# Patient Record
Sex: Male | Born: 1960 | Race: White | Hispanic: No | Marital: Married | Smoking: Former smoker
Health system: Southern US, Community
[De-identification: ages and names within clinical notes are randomized; demographics above are authoritative.]

## PROBLEM LIST (undated history)

## (undated) DIAGNOSIS — I429 Cardiomyopathy, unspecified: Secondary | ICD-10-CM

## (undated) DIAGNOSIS — I2510 Atherosclerotic heart disease of native coronary artery without angina pectoris: Secondary | ICD-10-CM

## (undated) DIAGNOSIS — I5042 Chronic combined systolic (congestive) and diastolic (congestive) heart failure: Secondary | ICD-10-CM

## (undated) DIAGNOSIS — E669 Obesity, unspecified: Secondary | ICD-10-CM

## (undated) DIAGNOSIS — I255 Ischemic cardiomyopathy: Secondary | ICD-10-CM

## (undated) DIAGNOSIS — I219 Acute myocardial infarction, unspecified: Secondary | ICD-10-CM

## (undated) DIAGNOSIS — E785 Hyperlipidemia, unspecified: Secondary | ICD-10-CM

## (undated) DIAGNOSIS — I10 Essential (primary) hypertension: Secondary | ICD-10-CM

## (undated) DIAGNOSIS — R918 Other nonspecific abnormal finding of lung field: Secondary | ICD-10-CM

## (undated) DIAGNOSIS — I77810 Thoracic aortic ectasia: Secondary | ICD-10-CM

## (undated) HISTORY — PX: CORONARY STENT PLACEMENT: SHX1402

## (undated) HISTORY — DX: Acute myocardial infarction, unspecified: I21.9

## (undated) HISTORY — DX: Ischemic cardiomyopathy: I25.5

## (undated) HISTORY — DX: Atherosclerotic heart disease of native coronary artery without angina pectoris: I25.10

## (undated) HISTORY — DX: Essential (primary) hypertension: I10

## (undated) HISTORY — PX: OTHER SURGICAL HISTORY: SHX169

## (undated) HISTORY — DX: Other nonspecific abnormal finding of lung field: R91.8

## (undated) HISTORY — DX: Chronic combined systolic (congestive) and diastolic (congestive) heart failure: I50.42

## (undated) HISTORY — DX: Thoracic aortic ectasia: I77.810

## (undated) HISTORY — PX: ESOPHAGOGASTRODUODENOSCOPY: SHX1529

## (undated) HISTORY — PX: HERNIA REPAIR: SHX51

## (undated) HISTORY — DX: Obesity, unspecified: E66.9

## (undated) HISTORY — DX: Cardiomyopathy, unspecified: I42.9

## (undated) HISTORY — DX: Hyperlipidemia, unspecified: E78.5

---

## 2001-07-30 ENCOUNTER — Encounter: Payer: Self-pay | Admitting: Family Medicine

## 2001-07-30 ENCOUNTER — Encounter: Admission: RE | Admit: 2001-07-30 | Discharge: 2001-07-30 | Payer: Self-pay | Admitting: Family Medicine

## 2001-08-09 ENCOUNTER — Encounter: Payer: Self-pay | Admitting: Family Medicine

## 2001-08-09 ENCOUNTER — Encounter: Admission: RE | Admit: 2001-08-09 | Discharge: 2001-08-09 | Payer: Self-pay | Admitting: Family Medicine

## 2002-01-16 ENCOUNTER — Emergency Department (HOSPITAL_COMMUNITY): Admission: EM | Admit: 2002-01-16 | Discharge: 2002-01-16 | Payer: Self-pay | Admitting: Emergency Medicine

## 2006-06-20 DIAGNOSIS — I219 Acute myocardial infarction, unspecified: Secondary | ICD-10-CM

## 2006-06-20 HISTORY — DX: Acute myocardial infarction, unspecified: I21.9

## 2006-06-21 ENCOUNTER — Ambulatory Visit: Payer: Self-pay | Admitting: Cardiology

## 2006-06-21 ENCOUNTER — Inpatient Hospital Stay (HOSPITAL_COMMUNITY): Admission: EM | Admit: 2006-06-21 | Discharge: 2006-06-26 | Payer: Self-pay | Admitting: Emergency Medicine

## 2006-06-24 ENCOUNTER — Encounter: Payer: Self-pay | Admitting: Cardiology

## 2006-06-27 ENCOUNTER — Observation Stay (HOSPITAL_COMMUNITY): Admission: EM | Admit: 2006-06-27 | Discharge: 2006-06-28 | Payer: Self-pay | Admitting: Emergency Medicine

## 2006-07-23 ENCOUNTER — Ambulatory Visit: Payer: Self-pay | Admitting: Cardiology

## 2006-09-17 ENCOUNTER — Ambulatory Visit: Payer: Self-pay | Admitting: Cardiology

## 2006-09-17 ENCOUNTER — Ambulatory Visit: Payer: Self-pay

## 2006-09-17 LAB — CONVERTED CEMR LAB
ALT: 28 units/L (ref 0–40)
AST: 22 units/L (ref 0–37)
Albumin: 4.2 g/dL (ref 3.5–5.2)
Alkaline Phosphatase: 59 units/L (ref 39–117)
Bilirubin, Direct: 0.1 mg/dL (ref 0.0–0.3)
Cholesterol: 188 mg/dL (ref 0–200)
HDL: 41.8 mg/dL (ref >39.0)
LDL Cholesterol: 126 mg/dL — ABNORMAL HIGH (ref 0–99)
Total Bilirubin: 0.9 mg/dL (ref 0.3–1.2)
Total CHOL/HDL Ratio: 4.5
Total Protein: 6.8 g/dL (ref 6.0–8.3)
Triglycerides: 99 mg/dL (ref 0–149)
VLDL: 20 mg/dL (ref 0–40)

## 2006-12-08 ENCOUNTER — Ambulatory Visit: Payer: Self-pay | Admitting: Cardiology

## 2006-12-22 ENCOUNTER — Ambulatory Visit: Payer: Self-pay | Admitting: Cardiology

## 2006-12-22 LAB — CONVERTED CEMR LAB
ALT: 42 units/L — ABNORMAL HIGH (ref 0–40)
AST: 32 units/L (ref 0–37)
Albumin: 3.8 g/dL (ref 3.5–5.2)
Alkaline Phosphatase: 53 units/L (ref 39–117)
Bilirubin, Direct: 0.1 mg/dL (ref 0.0–0.3)
Cholesterol: 186 mg/dL (ref 0–200)
Glucose, Bld: 107 mg/dL — ABNORMAL HIGH (ref 70–99)
HDL: 40.9 mg/dL (ref >39.0)
LDL Cholesterol: 127 mg/dL — ABNORMAL HIGH (ref 0–99)
Total Bilirubin: 0.8 mg/dL (ref 0.3–1.2)
Total CHOL/HDL Ratio: 4.5
Total Protein: 6.5 g/dL (ref 6.0–8.3)
Triglycerides: 89 mg/dL (ref 0–149)
VLDL: 18 mg/dL (ref 0–40)

## 2007-01-21 ENCOUNTER — Ambulatory Visit: Payer: Self-pay | Admitting: Cardiology

## 2009-06-12 ENCOUNTER — Encounter (INDEPENDENT_AMBULATORY_CARE_PROVIDER_SITE_OTHER): Payer: Self-pay | Admitting: *Deleted

## 2009-07-07 ENCOUNTER — Emergency Department (HOSPITAL_COMMUNITY): Admission: EM | Admit: 2009-07-07 | Discharge: 2009-07-07 | Payer: Self-pay | Admitting: Emergency Medicine

## 2009-10-23 DIAGNOSIS — I252 Old myocardial infarction: Secondary | ICD-10-CM

## 2009-10-23 DIAGNOSIS — I10 Essential (primary) hypertension: Secondary | ICD-10-CM

## 2009-10-23 DIAGNOSIS — I2510 Atherosclerotic heart disease of native coronary artery without angina pectoris: Secondary | ICD-10-CM | POA: Insufficient documentation

## 2009-10-23 DIAGNOSIS — E669 Obesity, unspecified: Secondary | ICD-10-CM

## 2009-10-23 DIAGNOSIS — E7800 Pure hypercholesterolemia, unspecified: Secondary | ICD-10-CM | POA: Insufficient documentation

## 2009-10-24 ENCOUNTER — Ambulatory Visit: Payer: Self-pay | Admitting: Cardiology

## 2009-12-26 ENCOUNTER — Ambulatory Visit: Payer: Self-pay | Admitting: Cardiology

## 2010-01-29 ENCOUNTER — Ambulatory Visit: Payer: Self-pay | Admitting: Cardiology

## 2010-02-04 LAB — CONVERTED CEMR LAB
ALT: 30 units/L (ref 0–53)
AST: 25 units/L (ref 0–37)
Albumin: 4.3 g/dL (ref 3.5–5.2)
Alkaline Phosphatase: 64 units/L (ref 39–117)
Bilirubin, Direct: 0.1 mg/dL (ref 0.0–0.3)
Cholesterol: 197 mg/dL (ref 0–200)
HDL: 41.9 mg/dL (ref >39.00)
LDL Cholesterol: 134 mg/dL — ABNORMAL HIGH (ref 0–99)
Total Bilirubin: 0.7 mg/dL (ref 0.3–1.2)
Total CHOL/HDL Ratio: 5
Total Protein: 6.9 g/dL (ref 6.0–8.3)
Triglycerides: 108 mg/dL (ref 0.0–149.0)
VLDL: 21.6 mg/dL (ref 0.0–40.0)

## 2010-02-15 ENCOUNTER — Telehealth: Payer: Self-pay | Admitting: Cardiology

## 2010-07-28 LAB — CONVERTED CEMR LAB
ALT: 28 units/L (ref 0–53)
AST: 27 units/L (ref 0–37)
Albumin: 4.5 g/dL (ref 3.5–5.2)
Alkaline Phosphatase: 55 units/L (ref 39–117)
BUN: 16 mg/dL (ref 6–23)
Bilirubin, Direct: 0 mg/dL (ref 0.0–0.3)
CO2: 27 meq/L (ref 19–32)
Calcium: 9.6 mg/dL (ref 8.4–10.5)
Chloride: 107 meq/L (ref 96–112)
Cholesterol: 213 mg/dL — ABNORMAL HIGH (ref 0–200)
Creatinine, Ser: 1 mg/dL (ref 0.4–1.5)
Direct LDL: 152.7 mg/dL
GFR calc non Af Amer: 84.29 mL/min (ref >60)
Glucose, Bld: 93 mg/dL (ref 70–99)
HDL: 49.6 mg/dL (ref >39.00)
Potassium: 4.3 meq/L (ref 3.5–5.1)
Sodium: 141 meq/L (ref 135–145)
Total Bilirubin: 0.4 mg/dL (ref 0.3–1.2)
Total CHOL/HDL Ratio: 4
Total Protein: 7.1 g/dL (ref 6.0–8.3)
Triglycerides: 123 mg/dL (ref 0.0–149.0)
VLDL: 24.6 mg/dL (ref 0.0–40.0)

## 2010-07-30 NOTE — Assessment & Plan Note (Signed)
Summary: Luis Mitchell  kMedications Added CLARITIN 10 MG TABS (LORATADINE) take one tablets AMLODIPINE BESYLATE 5 MG TABS (AMLODIPINE BESYLATE) Take one tablet by mouth daily      Allergies Added: ! [facility]  Primary Provider:  MItchell  CC:  Luis Mitchell/per pt spouse.  Marland Kitchen  History of Present Illness: Overall patient has been doing well.  Has not seen a doctor in quite some time. His B  Current Medications (verified): 1)  Toprol Xl 100 Mg Xr24h-Tab (Metoprolol Succinate) .Marland Kitchen.. 1 Tab Once Daily 2)  Plavix 75 Mg Tabs (Clopidogrel Bisulfate) .... Take One Tablet By Mouth Daily 3)  Crestor 10 Mg Tabs (Rosuvastatin Calcium) .... Take One Tablet By Mouth Daily. 4)  Claritin 10 Mg Tabs (Loratadine) .... Take One Tablets  Allergies (verified): 1)  ! Nsaids 2)  ! Asa 3)  ! * Bananas and Kwii 4)  ! * Zpack   Vital Signs:  Patient profile:   50 year old male Height:      67 inches Weight:      259 pounds BMI:     40.71 Pulse rate:   88 / minute Pulse rhythm:   regular BP sitting:   142 / 100  (left arm) Cuff size:   large  Vitals Entered By: Burnett Kanaris, CNA (October 24, 2009 4:30 PM)  Physical Exam  General:  Overweight gentleman in no distress.   Head:  normocephalic and atraumatic Eyes:  PERRLA/EOM intact; conjunctiva and lids normal. Lungs:  Clear bilaterally to auscultation and percussion. Heart:  PMI non displaced.  Normal S1 and S2.  No murmur Abdomen:  Bowel sounds positive; abdomen soft and non-tender without masses, organomegaly, or hernias noted. No hepatosplenomegaly. Extremities:  No clubbing or cyanosis. Neurologic:  Alert and oriented x 3.   Cardiac Cath  Procedure date:  06/21/2006  Findings:       CONCLUSIONS:  1. Acute inferolateral wall myocardial infarction treated with primary      percutaneous coronary intervention with a non drug-eluting      platform.  2. A small area of distal embolization.  3. High-grade stenosis of the large diagonal branch involving also  a      bifurcation of the diagonal itself.   DISPOSITION:  I plan to review the films with my colleagues to make a  final decision regarding the diagonal.  It is clearly very unfavorable  for percutaneous intervention.  We may need to treat him medically and  since the LAD is not involved specifically, we would be reluctant to  proceed with surgery.  Cardiac Cath  Procedure date:  06/21/2006  Findings:      ANGIOGRAPHIC DATA.:  1. Ventriculography in the RAO projection revealed inferobasal      hypokinesis.  There did not appear to be significant mitral      regurgitation, the estimate of the ejection fraction being in the      range of 50%.  2. The left main is free of critical disease.  3. The LAD courses to the apex.  There is a fair amount of luminal      irregularity but no areas of high-grade focal stenosis throughout      the main body and mid and distal portions of the LAD.  Importantly,      there is a very large diagonal branch that has a 90% stenosis at      its takeoff.  Just distal to its takeoff is the second sub branch,  and this sub branch also has 70-80% narrowing at its ostium.  The      distal distribution of the diagonal is relatively large.  4. The circumflex artery demonstrates segmental plaquing of about 50%      to perhaps slightly less in the midportion of the vessel before its      bifurcation into two marginal branches.  5. The right coronary artery is a large-caliber vessel.  There some      luminal irregularity proximally and the vessel is totally occluded      in the midportion.  Following opening, the stenosis is fairly short      and discrete and just above the takeoff of a moderate-sized right      ventricular branch.  Just after this there is about 30% narrowing.      There is fairly marked tortuosity in the distal vessel prior to the     PDA with about 30% narrowing as well.  There are at least four more      side branches and then the  vessel and is with a slight area of      contrast hang-up distally.  The distal distribution beyond this is      unclear.  6. Importantly, the circumflex is at least moderate in size and likely      covers most of the lateral wall.  EKG  Procedure date:  10/24/2009  Findings:      NSR.  Inferior MI, old  Impression & Recommendations:  Problem # 1:  CORONARY ARTERY DISEASE (ICD-414.00) See cath report.  Had non DES to RCA for AMI, with some residual disease.  Has not been seen in about three years or so.  Has been stable, without chest pain.  Overall, doing well at present without ischemic symptoms.   His updated medication list for this problem includes:    Toprol Xl 100 Mg Xr24h-tab (Metoprolol succinate) .Marland Kitchen... 1 tab once daily    Plavix 75 Mg Tabs (Clopidogrel bisulfate) .Marland Kitchen... Take one tablet by mouth daily    Amlodipine Besylate 5 Mg Tabs (Amlodipine besylate) .Marland Kitchen... Take one tablet by mouth daily  Orders: EKG w/ Interpretation (93000) TLB-BMP (Basic Metabolic Panel-BMET) (80048-METABOL) TLB-Lipid Panel (80061-LIPID) TLB-Hepatic/Liver Function Pnl (80076-HEPATIC)  Problem # 2:  HYPERTENSION, UNSPECIFIED (ICD-401.9) Very poor control.  Consistent diastolics in 100 range.  Options and treatment reviewed.  Needs weight loss, and also needs additional medication.  He understands about his allergic history, and is willing to try something, and will call us if we need to change medications. Asked him to keep a log of his BP, and let us know how things look from that standpoint.  His updated medication list for this problem includes:    Toprol Xl 100 Mg Xr24h-tab (Metoprolol succinate) .Marland Kitchen... 1 tab once daily    Amlodipine Besylate 5 Mg Tabs (Amlodipine besylate) .Marland Kitchen... Take one tablet by mouth daily  Orders: EKG w/ Interpretation (93000) TLB-BMP (Basic Metabolic Panel-BMET) (80048-METABOL) TLB-Lipid Panel (80061-LIPID) TLB-Hepatic/Liver Function Pnl (80076-HEPATIC)  Problem # 3:   HYPERCHOLESTEROLEMIA (ICD-272.0) Has not had any tests in more than three years.  Will recheck lab values at this time.   His updated medication list for this problem includes:    Crestor 10 Mg Tabs (Rosuvastatin calcium) .Marland Kitchen... Take one tablet by mouth daily.  Orders: EKG w/ Interpretation (93000) TLB-BMP (Basic Metabolic Panel-BMET) (80048-METABOL) TLB-Lipid Panel (80061-LIPID) TLB-Hepatic/Liver Function Pnl (80076-HEPATIC)  Patient Instructions: 1)  Your physician recommends that you  have a lipid and liver profile, and a BMP  today.  2)  Your physician recommends that you schedule a follow-up appointment in: 3 MONTHS 3)  Your physician has recommended you make the following change in your medication: START Amlodipine 5mg  once a day Prescriptions: AMLODIPINE BESYLATE 5 MG TABS (AMLODIPINE BESYLATE) Take one tablet by mouth daily  #30 x 4   Entered by:   [name], RN, BSN   Authorized by:   [name], MD, [facility]   Signed by:   [name], RN, BSN on 10/24/2009   Method used:   Electronically to        [facility] [address] (retail)       [phone]       [place], [place]  [address]       Ph: [phone]       Fax: [phone]   RxID:   [id number]

## 2010-07-30 NOTE — Assessment & Plan Note (Signed)
Summary: 2 month rov/sl  Medications Added ZYRTEC ALLERGY 10 MG TABS (CETIRIZINE HCL) 1 tab by mouth once daily        Primary Provider:  MItchell  CC:  check up.  History of Present Illness: Just started the amlodipine a couple of weeks ago.  Last time he checked BP was 129/89.  Most of diastolics have continued to be high.  No chest pain at present.  Denies ongoing symptoms.   Just started Crestor last few days.  Not taking BP med regularly.   Current Medications (verified): 1)  Toprol Xl 100 Mg Xr24h-Tab (Metoprolol Succinate) .Marland Kitchen.. 1 Tab Once Daily 2)  Plavix 75 Mg Tabs (Clopidogrel Bisulfate) .... Take One Tablet By Mouth Daily 3)  Crestor 20 Mg Tabs (Rosuvastatin Calcium) .... Take One Tablet By Mouth Daily. 4)  Zyrtec Allergy 10 Mg Tabs (Cetirizine Hcl) .Marland Kitchen.. 1 Tab By Mouth Once Daily 5)  Amlodipine Besylate 5 Mg Tabs (Amlodipine Besylate) .... Take One Tablet By Mouth Daily  Allergies: 1)  ! Nsaids 2)  ! Asa 3)  ! * Bananas and Kwii 4)  ! * Zpack  Past History:  Past Medical History: Last updated: 10/23/2009 Current Problems:  CORONARY ARTERY DISEASE (ICD-414.00)- post percutaneous stentin  emergently with a non-drug eluting platform. MYOCARDIAL INFARCTION, HX OF (ICD-412)- anterior inferior HYPERTENSION, UNSPECIFIED (ICD-401.9) HYPERCHOLESTEROLEMIA (ICD-272.0) OBESITY, MODERATE (ICD-278.00)  Past Surgical History: Last updated: 10/23/2009  percutaneous stenting   emergently with a non-drug eluting platform.  Vital Signs:  Patient profile:   50 year old male Height:      67 inches Weight:      259 pounds BMI:     40.71 Pulse rate:   93 / minute Resp:     12 per minute BP sitting:   158 / 103  (right arm)  Vitals Entered By: Kem Parkinson (December 26, 2009 3:33 PM)  Physical Exam  General:  Well developed, well nourished, in no acute distress. Head:  normocephalic and atraumatic Eyes:  PERRLA/EOM intact; conjunctiva and lids normal. Lungs:  Clear  bilaterally to auscultation and percussion. Heart:  PMI non displaced.  Normal S1 and S2.  No murmur.  Abdomen:  Bowel sounds positive; abdomen soft and non-tender without masses, organomegaly, or hernias noted. No hepatosplenomegaly. Extremities:  No clubbing or cyanosis. Neurologic:  Alert and oriented x 3.   Impression & Recommendations:  Problem # 1:  CORONARY ARTERY DISEASE (ICD-414.00) No recurrent symptoms His updated medication list for this problem includes:    Toprol Xl 100 Mg Xr24h-tab (Metoprolol succinate) .Marland Kitchen... 1 tab once daily    Plavix 75 Mg Tabs (Clopidogrel bisulfate) .Marland Kitchen... Take one tablet by mouth daily    Amlodipine Besylate 5 Mg Tabs (Amlodipine besylate) .Marland Kitchen... Take one tablet by mouth daily  His updated medication list for this problem includes:    Toprol Xl 100 Mg Xr24h-tab (Metoprolol succinate) .Marland Kitchen... 1 tab once daily    Plavix 75 Mg Tabs (Clopidogrel bisulfate) .Marland Kitchen... Take one tablet by mouth daily    Amlodipine Besylate 5 Mg Tabs (Amlodipine besylate) .Marland Kitchen... Take one tablet by mouth daily  Problem # 2:  HYPERCHOLESTEROLEMIA (ICD-272.0) Only taking for past four days.  Will recheck after a few more days.  His updated medication list for this problem includes:    Crestor 20 Mg Tabs (Rosuvastatin calcium) .Marland Kitchen... Take one tablet by mouth daily.  Problem # 3:  HYPERTENSION, UNSPECIFIED (ICD-401.9) NOt well controlled.   His updated medication list for this problem  includes:    Toprol Xl 100 Mg Xr24h-tab (Metoprolol succinate) .Marland Kitchen... 1 tab once daily    Amlodipine Besylate 5 Mg Tabs (Amlodipine besylate) .Marland Kitchen... Take one tablet by mouth daily  Patient Instructions: 1)  Your physician recommends that you return for a FASTING LIPID and LIVER Profile in 4 WEEKS.  2)  Your physician recommends that you continue on your current medications as directed. Please refer to the Current Medication list given to you today. 3)  Your physician wants you to follow-up in: 4 MONTHS.   You will receive a reminder letter in the mail two months in advance. If you don't receive a letter, please call our office to schedule the follow-up appointment. 4)  Your physician has requested that you regularly monitor and record your blood pressure readings at home.  Please use the same machine at the same time of day to check your readings and record them to bring to your follow-up visit.

## 2010-07-30 NOTE — Progress Notes (Signed)
Summary: Lab results  Medications Added CRESTOR 40 MG TABS (ROSUVASTATIN CALCIUM) Take one tablet by mouth daily.       Phone Note Call from Patient Call back at [facility] Phone [phone]   Caller: Patient Summary of Call: Labs results Initial call taken by: Luis Mitchell,  February 15, 2010 12:17 PM  Follow-up for Phone Call        pt returned call-pls call [phone] Luis Mitchell  February 15, 2010 2:08 PM  Per pt calling back in regards to lab work [phone] Luis Mitchell  February 15, 2010 4:26 PM   Left message to call back Luis Goody, RN  February 15, 2010 5:52 PM  Left message for pt to call back. Luis Gutting, RN, BSN  February 18, 2010 9:08 AM   Additional Follow-up for Phone Call Additional follow up Details #1::        I spoke with the pt and made him aware of his lab results.  The pt has tolerated Crestor 20mg  without any side effects.  The pt will increase Crestor to 40mg  once a day and have labs checked in 6 WEEKS (04/01/10).  Lab appt slip mailed to the pt's home.  Additional Follow-up by: Luis Gutting, RN, BSN,  February 18, 2010 11:03 AM    New/Updated Medications: CRESTOR 40 MG TABS (ROSUVASTATIN CALCIUM) Take one tablet by mouth daily. Prescriptions: CRESTOR 40 MG TABS (ROSUVASTATIN CALCIUM) Take one tablet by mouth daily.  #30 x 6   Entered by:   Luis Gutting, RN, BSN   Authorized by:   Luis Miyamoto, MD, [facility]   Signed by:   Luis Gutting, RN, BSN on 02/18/2010   Method used:   Electronically to        [facility] [address] (retail)       [phone]       [place], [place]  [address]       Ph: [phone]       Fax: [phone]   RxID:   [id number]

## 2010-11-12 NOTE — Assessment & Plan Note (Signed)
[facility]                            CARDIOLOGY OFFICE NOTE   NAME:Luis Mitchell, Luis Mitchell                      MRN:          [id number]  DATE:01/21/2007                            DOB:          April 11, 1961    Luis Mitchell is in for followup.  In general, he is doing well.  He has  not been having any recurrent chest pain.  He thought that the Plavix  might have something to do with the crusting on his foot, but clearly  that has not had any impact and we talked about the possibility of going  back on his Plavix on a regular basis, since he cannot take aspirin.  He  also has residual coronary disease.  Moreover, he had his biopsy done of  his foot, and he does have evidence of possibly some staph.  In  addition, he has developed more erythema at a different location on his  foot with some crusting and pustular formation.  Since starting an  antibiotic yesterday, his overall situation has improved.  He is not  sure which antibiotic he is currently taking.  His last blood pressure  at home was 140/100.   PHYSICAL:  He is alert and oriented.  Blood pressure is 160/102 with a pulse of 96.  The lung fields are clear.  CARDIAC:  Rhythm is regular.  Over the foot, the crusting is improving and healing.  He has developed  more erythema over the top of the foot, which is a different area.  There is a pustular lesion on top, which he says is improved since  starting on the antibiotics.   IMPRESSION:  1. Coronary artery disease status post acute myocardial infarction      treated with stenting.  2. Hypertension, under borderline control.  3. Moderate obesity.  4. Multiple allergies.  5. Foot infection, currently under treatment.   PLAN:  1. I increased his metoprolol XL to 100 mg daily and instructed him in      its use.  2. I have asked him to return to see Dr. Lupe Carney for followup      with his primary care physician regarding blood pressure  management      and control.  3. I have asked him to take his blood pressures at home and record      these.  They have generally been somewhat lower, but we will      monitor them closely.  4. He will continue followup with Dr. Janalyn Harder.  5. We within see him back in cardiology clinic in 3 months.     Arturo Morton. Riley Kill, MD, [facility]  Electronically Signed    TDS/MedQ  DD: 01/21/2007  DT: 01/21/2007  Job #: [id number]   cc:   Ria Bush. Jorja Loa, M.D.  Elsworth Soho, M.D.

## 2010-11-12 NOTE — Letter (Signed)
December 08, 2006    Luis Mitchell, M.D.  [address] [place],  [place] [address]   RE:  Luis, Mitchell  MRN:  [id number]  /  DOB:  October 05, 1960   Dear Luis Mitchell:   I had the pleasure of seeing Luis Mitchell in the office today and to  address some specific issues. As you know, Luis Mitchell initially  presented with an acute myocardial infarction. He was treated with  balloon angioplasty and stenting using a non-drug coated stent. As you  know, he has had an ASPIRIN ALLERGY, and he developed foot swelling  shortly after the procedure. He was given antihistamines and the aspirin  was discontinued with fairly marked improvement of his feet within 24  hours. He does say that he has had this same type of reaction in the  past. He also mentioned that he has had some continuing changes on the  medial aspect of his left foot, which has persisted, and he has been  using neosporin cream intermittently. He also tells me that his blood  pressures at home have been at times 115-120/80 and they have not really  been significantly elevated. Recently, he ran out of Plavix and he  stopped his Plavix for about three days and during that period part of  one of the rashes resolved and he spoke with the pharmacist who told him  this could be a Plavix reaction. Both Luis Mitchell and I reviewed this  today, and it does not look typical to [place] as to what Plavix would cause,  but I was in agreement with the patient that we should probably stop his  Plavix which would virtually take him off of all anti-platelet therapy.  Important, however, the patient does not have a drug-coated stent so  theoretically the stent should be fully endothelialized within about 28  days. He does remain on Toprol, and is taking Toprol 50 mg 1.5 tablets  daily as well as Crestor now 10 mg daily.   Today, on examination, the blood pressure was 150/100. The pulse was 84.  The lung fields were clear.  CARDIAC: Rhythm was regular  without a significant murmur.  Examination of the feet revealed good pulses. He has an eczematous type  lesion involving the medial aspect of the left foot with a small area of  erythema just above this area. On the right foot, are matching lesions,  although with less intensity.   The electrocardiogram reveals normal sinus rhythm and an inferior wall  myocardial infarction of indeterminate age with T-wave inversion in  leads 3. These correspond to the area where he had the acute infarction.   Overall, Luis Mitchell is stable. We have him scheduled to come back to  the Cardiology Clinic in about a month. Because the stent should be  fully endothelialized, it was my feeling that Plavix could be stopped to  see if his symptoms actually would improve. This is not a typical  reaction to Plavix and I have taken the liberty of actually scheduling  him an appointment to see Dermatology. I think that this would be  important and helpful in trying to determine exactly what type of  response this is on the medial aspect of his foot. We also plan to get a  lipid profile on him on the higher dose of Crestor to see if this has  been of benefit as we are trying to optimize his cardiac risk. I  appreciate the opportunity of sharing  in his care and we will see him  back in followup in about 4 weeks. I will send you a note that time.    Sincerely,      Luis Mitchell. Luis Kill, MD, [facility]  Electronically Signed    Luis Mitchell  DD: 12/08/2006  DT: 12/08/2006  Job #: [id number]   CC:    Luis Bush. Jorja Mitchell, M.D.

## 2010-11-15 NOTE — H&P (Signed)
NAME:  Luis Mitchell, Luis [name]:  [id number]   MEDICAL RECORD NO.:  [id number]          PATIENT TYPE:  OBV   LOCATION:  1826                         FACILITY:  [facility]   PHYSICIAN:  Melissa L. Ladona Ridgel, MD  DATE OF BIRTH:  08-16-1960   DATE OF ADMISSION:  06/27/2006  DATE OF DISCHARGE:                              HISTORY & PHYSICAL   CHIEF COMPLAINT:  Lip swelling.   PRIMARY CARE PHYSICIAN:  L. Lupe Carney, M.D.   HISTORY OF PRESENT ILLNESS:  The patient is a 50 year old white male  status post cardiac catheterization for a myocardial infarction.  He was  discharged yesterday.  The patient went out to eat today at [employer].  He ate  one bite of salad and noted that he could taste the preservatives and  therefore, did not eat any further.  Within the next hour, he developed  lower lip swelling and tongue fullness.  He went home and took a Zyrtec  and the swelling continued and therefore, he came to the emergency room  for further evaluation.   In the emergency room the patient was treated with Benadryl and  prednisone with slight improvement.  We have been asked to admit the  patient for further observation.   REVIEW OF SYSTEMS:  No shortness of breath.  He did have mild tongue  fullness.  No nausea.  No vomiting.  No fever, all other review of  systems appear to be negative.   PAST MEDICAL HISTORY:  1. Status post myocardial infarction with CAD with stenting, recently      released.  His myocardial infarction was an anterior inferior      myocardial infarction.  The patient has 3-vessel disease.  The      stents were placed in the right coronary artery.  2. He has hypercholesterolemia but is currently not on a Statin      because of his known hyper-sensitivity to medications.  3. He previously had a nail embedded in his buttocks which needed to      be removed.   SOCIAL HISTORY:  He does not smoke.  He occasionally drinks alcohol.  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   FAMILY HISTORY:  Mom is living with GERD and a hiatal hernia as well as  hypertension.  Dad is deceased of an MI at the age of 71.   ALLERGIES:  1. ASPIRIN.  2. NSAIDS.  3. ENVIRONMENTAL ALLERGENS.  4. BANANAS AND KIWI WHICH CAUSE ANAPHYLAXIS.   MEDICATIONS:  1. Plavix 75 mg every day.  2. Toprol XL every day.  3. Zyrtec 10 mg every day.   PHYSICAL EXAMINATION:  VITAL SIGNS:  Temperature is 98.9, blood pressure  was 159/114 now 138/87, heart rate was 110 but now down to 96,  respirations 18, saturation 100%.  GENERAL:  This is well-developed, well-nourished, moderately obese,  white male in no acute distress.  HEENT:  He is normocephalic atraumatic with pupils equal, round, and  reactive to light.  Extraocular muscles are intact.  Mucous membranes  are moist.  He has no edema of the tongue.  He does have, however, lower  lip edema bilaterally and asymmetrical lip edema on the left side.  NECK:  Supple.  There is no JVD.  No lymph nodes.  No carotid bruits.  CHEST:  Clear to auscultation.  There are no rhonchi, rales, or wheezes.  CARDIOVASCULAR:  Regular rate and rhythm.  Positive S1 S2.  No S3 S4.  No murmurs, rubs, or gallops.  ABDOMEN:  Soft, nontender, nondistended with positive bowel sounds.  EXTREMITIES:  Show no edema, clubbing, cyanosis, or rashes.  NEUROLOGIC:  He is awake, alert, oriented.  Cranial nerves II-XII are  intact.   No laboratory values have been ordered.   ASSESSMENT/:  This is a 50 year old white male with known multiple  allergies and previous anaphylactic reactions who presents after the  development  of lip edema from eating a salad which likely had MSG or  another preservative in place.  The patient states, I could taste the  preservative, and subsequently developed the lip edema.   PLAN:  1. Anaphylactic reaction likely secondary to MSG or other      preservative.  We will continue his Zyrtec,  Benadryl, and      prednisone.  I will try to avoid epinephrine as the patient is      recently discharged with myocardial infarction requiring stent      placement.  2. Cardiovascular, status post MI.  We will continue with Toprol and      Plavix.  3. Pulmonary.  He is currently in no respiratory distress but I will      instruct the nurses to please call even if there is a hint of      discomfort.  4. GI.  No complaints.  5. GU.  No complaints.  6. DVT.  Early ambulation.  7. Allergy/immunology.  The patient has been followed closely by an      allergist and I would recommend followup as an outpatient.  At this      time, I am not going to draw any C1 esterase inhibitor levels or      anything as I suspect he has been worked up closely as an      outpatient.      Melissa L. Ladona Ridgel, MD  Electronically Signed     MLT/MEDQ  D:  06/27/2006  T:  06/27/2006  Job:  [id number]   cc:   Jessica Priest, M.D.  Arturo Morton. Riley Kill, MD, [facility]

## 2010-11-15 NOTE — H&P (Signed)
NAMEMarland Kitchen  JAKOBIE, HENSLEE NO.:  [id number]   MEDICAL RECORD NO.:  [id number]          PATIENT TYPE:  INP   LOCATION:  2910                         FACILITY:  [facility]   PHYSICIAN:  Arturo Morton. Riley Kill, MD, FACCDATE OF BIRTH:  12-Jan-1961   DATE OF ADMISSION:  06/21/2006  DATE OF DISCHARGE:                              HISTORY & PHYSICAL   CHIEF COMPLAINT:  Chest pain.   HISTORY OF PRESENT ILLNESS:  The patient is a 50 year old gentleman who  has no prior history of myocardial infarction.  He has been taking some  nasal sprays for the past 2 days because of sinusitis.  He presents with  chest pain that started about 2 days earlier.  It has been stuttering on  and off since that time.  It became severe this afternoon, and he went  to urgent care in [place] where a diagnosis of inferior ST elevation  myocardial infarction was made.  He was transferred promptly to the  [facility] Emergency Room where he received oral aspirin and 5000 units  of intravenous heparin.  He was subsequently brought to the  catheterization laboratory urgently.  He spent 11 minutes in the  emergency room.   PAST MEDICAL HISTORY:  The patient has allergies to [facility] and KIWI.  He apparently had an insect sting previously and has had evaluation by  Dr. Stevphen Rochester.  He takes no medicines at present.  The patient has  had surgery.  He also has a history of hypertension and  hypercholesterolemia, and he has previously been on Lipitor but stopped  it on his own.   FAMILY HISTORY:  His father died of 42 of a myocardial infarction.  Mother is alive at 79.  He has 2 siblings, both sisters, who are well.   The patient does not smoke.  He does specialty carpentry with trim.  He  has 2 children, age 10 and 48.   REVIEW OF SYSTEMS:  He has had some recent sinus congestion and he feels  like his head is locked up.   On examination, he was briefly examined in the emergency room.  He was  alert and  oriented and complaining of chest pain.  The blood pressure is  160/110.  The pulse was 80, respiratory rate 18.  Temperature was  afebrile.  There was no jugular venous distension.  Carotid upstrokes  were brisk without bruits.  He was modestly obese.  The lung fields were  clear to auscultation and percussion.  The PMI was nondisplaced, and  there was normal first, second heart sound without murmurs, rubs or  gallops.  Abdomen was soft without hepatosplenomegaly.  Extremities  revealed no edema.  Pulses were intact.  Neurologic exam was brief but  grossly nonfocal.   The electrocardiogram demonstrates sinus rhythm with inferior ST  elevation and reciprocal ST-segment changes compatible with myocardial  infarction.   IMPRESSION:  1. Acute inferior wall myocardial infarction.  2. History of hyperlipidemia.  3. History of hypertension.  4. Possible dysmetabolic syndrome.   PLAN:  The patient was brought up promptly to the catheterization  laboratory.  I-STAT was done, and the BUN and creatinine were adequate  for percutaneous intervention.  We discussed the Alla Feeling trial with the  patient, and he was agreeable to proceed with urgent catheterization and  possible PCI in the specific protocol.  I spoke with his wife in detail,  and she accompanied [place] to the catheterization laboratory.      Arturo Morton. Riley Kill, MD, [facility]  Electronically Signed     TDS/MEDQ  D:  06/21/2006  T:  06/22/2006  Job:  [id number]   cc:   L. Lupe Carney, M.D.

## 2010-11-15 NOTE — Procedures (Signed)
[facility]                              EXERCISE TREADMILL   NAME:Zammit, KENGO STURGES                      MRN:          [id number]  DATE:09/17/2006                            DOB:          Dec 19, 1960    Mr. Louro exercised today on the Bruce protocol.  Exercise tolerance  was fair.  He experienced no chest pain, and the test was terminated due  to achieved heart rate.  Peak heart rate reached 171 which is 98% of age-  predicted maximum.  He had no chest pain.  There were no  electrocardiographic abnormalities.   The patient is stable.  He has had stepping of the right coronary artery  with a non-drug-eluting platform.  He does not tolerate aspirin.  He  does tolerate Plavix.  He is now out more than a month from the  procedure.  I believe he is okay to go back to work.  I am concerned  about his blood pressures, and we will have him recheck his blood  pressures at home and plan to bring them back to the office so we can  recheck his situation.  He will get a lipid and liver profile today.     Arturo Morton. Riley Kill, MD, [facility]  Electronically Signed    TDS/MedQ  DD: 09/17/2006  DT: 09/17/2006  Job #: [id number]

## 2010-11-15 NOTE — Cardiovascular Report (Signed)
NAMEMarland Kitchen  Luis Mitchell, [name]:  [id number]   MEDICAL RECORD NO.:  [id number]          PATIENT TYPE:  INP   LOCATION:  2910                         FACILITY:  [facility]   PHYSICIAN:  Arturo Morton. Riley Kill, MD, FACCDATE OF BIRTH:  [birth year]   DATE OF PROCEDURE:  06/21/2006  DATE OF DISCHARGE:                            CARDIAC CATHETERIZATION   INDICATIONS:  The patient is a 50 year old who has previously had a  diagnosis of hypertension and hyperlipidemia.  He has had some sinus  congestion in the past few days.  His head had been all stopped up.  He  subsequently presents with chest pain and evidence of inferoposterior  wall myocardial infarction.  He was brought to the catheterization  laboratory urgently for catheterization study and possible percutaneous  intervention.  The patient was agreeable to enrollment in the CHAMPION  trial.   DESCRIPTION OF PROCEDURE:  The patient was brought to the  catheterization laboratory and prepped and draped in the usual fashion.  Through an anterior puncture, the femoral artery was easily entered.  A  6-French sheath was initially placed.  Heparin had been given according  to protocol.  After access, bivalrudin was given.  The patient then had  pictures of the left coronary artery.  Following this, we were unable to  get a guide catheter to twist in the aorta due to iliac tortuosity, and  therefore the guiding catheter had to be removed and was replaced and  the short femoral sheath was replaced with a 23 cm sheath.  We then  replaced the guiding catheter with a new guiding catheter, which was a  JR-4 with side holes, and we could easily torque this now that we were  across the area of iliac tortuosity.  Views of the right coronary artery  demonstrated a total occlusion of the vessel.  The patient had adequate  anticoagulation and was given bivalrudin with an ACT in excess of 300  seconds.  The patient also received a study drug, that  being intravenous  P2/Y12 inhibitor versus oral P2/Y12 inhibitor.  Aspirin had been  previously administered.  As per the HORIZON protocol, bivalrudin was  used as a main anticoagulant.  The lesion was then crossed with a  Prowater wire.  Predilatation was done with a 2-mm balloon.  We then  carefully accessed the stenosis, and a 2.75 x 16 Liberte stent was then  placed in the lesion and carefully placed.  We pulled the stent back  slightly to avoid ending in the area of disease just after the RV  branch.  The stent was then deployed at 15 atmospheres.  There continued  to be some indentation at the stent site.  A 3.5 Quantum Maverick was  then used to post dilate up to 12 atmospheres.  Following this,  intravascular ultrasound was performed.  This demonstrated a reference  vessel diameter of about 3.7 x 3.7.  Notably, there was some mostly  excellent apposition but slight incomplete dilatation with a somewhat  smaller vessel in the midportion of the stent.  In addition, the  proximal portion of  the stent demonstrated lack of apposition during the  last millimeter.  Following this, we elected to go ahead and do a repeat  post dilatation using a 3.5 PowerSail balloon.  The PowerSail was taken  up to 15-16 atmospheres.  There was dramatic improvement in the  angiographic appearance.  Repeat intravascular ultrasound was performed.  This now demonstrated an improved lumen and complete apposition of the  stent.  There was no obvious evidence of edge tear.  The IVUS catheter  was then subsequently removed.  Final views were obtained.  Following  this, central aortic and left ventricular pressures were measured with a  pigtail.  Ventriculography was performed in the RAO projection.  There  were no complications.  The patient was taken to the coronary care unit  in satisfactory clinical condition.  The patient had relief of his  symptoms and marked ST resolution after dilatation.  There was  evidence  of a very distal area of contrast hang-up suggesting distal embolization  from the original acute infarct site with crossing, but the patient did  not have much chest pain and the STs were nearly resolved.  As a result,  and due to large tortuosity, this was not chased into the distal vessel.   HEMODYNAMIC DATA.:  1. Central aortic pressure 125/89, mean 107.  2. Left ventricular pressure 120/9.  3. No gradient on pullback across the aortic valve.   ANGIOGRAPHIC DATA.:  1. Ventriculography in the RAO projection revealed inferobasal      hypokinesis.  There did not appear to be significant mitral      regurgitation, the estimate of the ejection fraction being in the      range of 50%.  2. The left main is free of critical disease.  3. The LAD courses to the apex.  There is a fair amount of luminal      irregularity but no areas of high-grade focal stenosis throughout      the main body and mid and distal portions of the LAD.  Importantly,      there is a very large diagonal branch that has a 90% stenosis at      its takeoff.  Just distal to its takeoff is the second sub branch,      and this sub branch also has 70-80% narrowing at its ostium.  The      distal distribution of the diagonal is relatively large.  4. The circumflex artery demonstrates segmental plaquing of about 50%      to perhaps slightly less in the midportion of the vessel before its      bifurcation into two marginal branches.  5. The right coronary artery is a large-caliber vessel.  There some      luminal irregularity proximally and the vessel is totally occluded      in the midportion.  Following opening, the stenosis is fairly short      and discrete and just above the takeoff of a moderate-sized right      ventricular branch.  Just after this there is about 30% narrowing.      There is fairly marked tortuosity in the distal vessel prior to the     PDA with about 30% narrowing as well.  There are at least  four more      side branches and then the vessel and is with a slight area of      contrast hang-up distally.  The distal distribution beyond this is  unclear.  6. Importantly, the circumflex is at least moderate in size and likely      covers most of the lateral wall.   Following balloon and stenting, the midvessel was reduced to less than  10% residual luminal narrowing.  There is minimal dimpling at the lesion  site after the second post dilatation.   INTRAVASCULAR ULTRASOUND:  Intravascular ultrasound demonstrates some  modest diffuse disease both distally and proximally.  The reference  lumen diameter distally and proximally ranges anywhere from 3.5, and the  more proximal vessel was up to about 5 x 5.  The initial ultrasound  demonstrated reasonably good apposition of the stent throughout the  whole distal and midportion of the stent.  At the proximal tip of the  stent, there was incomplete apposition.  Also in the midportion of the  stent there was moderate narrowing remaining with about a 2.5 x 2.5  residual lumen with a fair amount of residual plaque.  Following the  second post dilatation with a 3.5 PowerSail, the stent diameter was  about 3 x 3 throughout and demonstrated good apposition without any  residual stent malapposition.  As noted previously, there were a fair  amount of proximal luminal irregularities and a fairly large-caliber  lumen.   CONCLUSIONS:  1. Acute inferolateral wall myocardial infarction treated with primary      percutaneous coronary intervention with a non drug-eluting      platform.  2. A small area of distal embolization.  3. High-grade stenosis of the large diagonal branch involving also a      bifurcation of the diagonal itself.   DISPOSITION:  I plan to review the films with my colleagues to make a  final decision regarding the diagonal.  It is clearly very unfavorable  for percutaneous intervention.  We may need to treat him medically  and  since the LAD is not involved specifically, we would be reluctant to  proceed with surgery.     Arturo Morton. Riley Kill, MD, [facility]  Electronically Signed    TDS/MEDQ  D:  06/21/2006  T:  06/22/2006  Job:  [id number]

## 2010-11-15 NOTE — Discharge Summary (Signed)
NAMEMarland Mitchell  MINORU, CHAP NO.:  [id number]   MEDICAL RECORD NO.:  [id number]          PATIENT TYPE:  INP   LOCATION:  2024                         FACILITY:  [facility]   PHYSICIAN:  Bettey Mare. Lawrence, NPDATE OF BIRTH:  01/02/1961   DATE OF ADMISSION:  06/21/2006  DATE OF DISCHARGE:                               DISCHARGE SUMMARY   PRIMARY CARDIOLOGIST:  Dr. Shawnie Pons.   PRIMARY CARE PHYSICIAN:  Dr. Neldon Labella.   PROCEDURES:  1. Cardiac catheterization completed by Dr. Riley Kill on June 21, 2006.      a.     Conclusions:  Acute inferolateral myocardial infarction       treated with primary percutaneous coronary intervention with a non-       drug eluding platform to the right coronary artery.  A small area       of distal embolization.  High-grade stenosis of the large diagonal       branch, involving also bifurcation of the diagonal itself.   I plan to review the films with my colleagues to make a final decision  regarding diagonal.  It is clearly very unfavorable for percutaneous  intervention.  We may need to treat him medically and since the LAV is  not involved specifically, we would be reluctant to proceed with  surgery.   PRIMARY DIAGNOSIS:  1. Acute inferior myocardial infarction.      a.     Status post cardiac catheterization, revealing 3-vessel       disease.  Please review Dr. Rosalyn Charters cardiac catheterization note       for details.   SECONDARY DIAGNOSES:  1. Hyperlipidemia.  2. Hypertension.  3. Allergy to aspirin.   HISTORY OF PRESENT ILLNESS:  This 50 year old obese Caucasian male with  no prior cardiac history was admitted from urgent care in [place],  [place] with the diagnosis of inferior ST elevation and  myocardial infarction.  The patient was brought emergently to [facility]  Emergency Room from [facility] and sent directly to cardiac  catheterization laboratory, where he was seen by Dr. Shawnie Pons.  The  patient was taken to the catheterization lab and cardiac  catheterization was completed, revealing a totally occluded right  coronary artery.  The patient did have percutaneous intervention to the  right coronary artery with a Liberty stent.  The RCA was 100% occluded  and is now less than 10% with TIMI-3 flow.  The patient was kept for  further evaluation in ICU and then transferred to the stepdown unit.  The patient did have evidence of ventricular tachycardia during recover.  This resolved on its own.  The patient was started on Toprol-XL,  aspirin, and Plavix during hospitalization.   The patient, on June 24, 2006, had severe swelling of both lower  extremities, left greater than right.  The patient had some evidence of  allergy to aspirin, although this was not specific; however, aspirin was  discontinued.  Zyrtec and Benadryl were given to the patient to help  with relief.  The day of discharge, the  patient's feet had returned to  normal with no further complaints.  The patient's right groin was  without evidence of hematoma, swelling, or infection.  Electrocardiogram  was completed during hospitalization revealing an EF of 50% to 55% with  basal inferior wall motion abnormality.  On the day of discharge, the  patient was up around in the room without any further complaints.  There  were no complaints of chest pain.  There was no ventricular ectopy seen  since day of cardiac catheterization.  The patient tolerated other  medications without difficulty and allergic reaction.   DISCHARGE VITAL SIGNS:  Blood pressure 108/78, heart rate 62,  respirations 20, temperature 97.7, O2 saturations 98% on room air.   LABORATORY DATA:  Sodium 139, potassium 4.8, chloride 104, CO2 25, BUN  15, creatinine 0.9.  Hemoglobin 12.5, hematocrit 37.1, platelets 340,  white blood cells 10.5.  Troponin max was found to be 83.44.  On  discharge, the patient's troponin was 6.16, CK 64, CK-MB 2.3.  AST  173,  ALT 48, alkaline phosphatase 58, total bilirubin 0.9, albumin 3.5.   FOLLOWUP APPOINTMENTS AND PLANS:  1. The patient is to be seen by Dr. Shawnie Pons on July 23, 2006, at 3:15 p.m.  2. The patient has been given post cardiac catheterization      instructions and told to call the office for any evidence of      bleeding, swelling, hematoma, or infection.  3. The patient will continue with cardiac rehab phase 2 post discharge      to be followed by cardiac rehab at Dr. Rosalyn Charters discretion.  4. The patient will not be returning to work until seen by Dr. Riley Kill      for his appointment in January.  He verbalizes understanding.   DISCHARGE MEDICATIONS:  1. Plavix 75 mg once a day.  2. Toprol-XL 50 mg once a day.  3. Zyrtec 10 mg p.o. daily p.r.n.  4. Nitroglycerin 0.4 mg sublingual p.r.n.   ALLERGIES:  New allergy to ASPIRIN, BANANAS, AND KIWI.   DURATION OF DISCHARGE:  Time spent with patient on discharge to include  physician time, 30 minutes.      Bettey Mare. Lyman Bishop, NP     KML/MEDQ  D:  06/26/2006  T:  06/27/2006  Job:  [id number]   cc:   L. Lupe Carney, M.D.

## 2010-11-15 NOTE — Assessment & Plan Note (Signed)
[facility]                            CARDIOLOGY OFFICE NOTE   NAME:[name], Luis OSMUN                      MRN:          [id number]  DATE:07/23/2006                            DOB:          [birth year]    [name] is in for a followup visit. In general, he is feeling great.  He has not had any recurrence. He is not on aspirin. He just takes  Plavix daily. He has tolerated everything reasonably well. He did have  to go back to the emergency room for an allergic reaction the following  night on his lips, but he has not had any more. He has not been on a  statin because he had trouble with his joints previously on Lipitor.   Today on examination, the weight is 242 pounds. Blood pressure is  155/105 and the pulse is 85.  The lung fields are clear.  The cardiac rhythm is regular.   Electrocardiogram demonstrates normal sinus rhythm with inferior  myocardial infarction of indeterminate age.   IMPRESSION:  1. Coronary artery disease, status post percutaneous stenting      emergently with a non-drug eluting platform.  2. ASPIRIN ALLERGY.  3. Mild hypercholesterolemia, not on lipid-lowering therapy because of      previous side effects.   PLAN:  1. Continue Plavix at present time.  2. Increase Toprol to 50 mg one and a half tablets daily for better      blood pressure control.  3. Institute Crestor 5 mg a day and check with a liver profile in 6      weeks.  4. Return to clinic in 4 to 6 weeks for an exercise tolerance test.     Arturo Morton. Riley Kill, MD, [facility]  Electronically Signed    TDS/MedQ  DD: 07/23/2006  DT: 07/23/2006  Job #: [id number]

## 2011-05-20 ENCOUNTER — Other Ambulatory Visit: Payer: Self-pay | Admitting: Cardiology

## 2011-09-08 ENCOUNTER — Other Ambulatory Visit: Payer: Self-pay | Admitting: Cardiology

## 2011-09-12 ENCOUNTER — Other Ambulatory Visit: Payer: Self-pay | Admitting: *Deleted

## 2011-09-12 ENCOUNTER — Telehealth: Payer: Self-pay | Admitting: Cardiology

## 2011-09-12 MED ORDER — METOPROLOL SUCCINATE ER 100 MG PO TB24: 100.0000 mg | ORAL_TABLET | Freq: Every day | ORAL | Status: DC

## 2011-09-12 NOTE — Telephone Encounter (Signed)
PT WAS ON TOPROL AND NEEDED REFILL ,NOT ON MED LIST BUT NEEDS CALLED IN PT'S IS OUT, USES [facility] [place], PLS CALL [phone] WIFE STARR

## 2011-11-18 ENCOUNTER — Other Ambulatory Visit: Payer: Self-pay | Admitting: Cardiology

## 2011-12-25 ENCOUNTER — Other Ambulatory Visit: Payer: Self-pay | Admitting: *Deleted

## 2011-12-25 MED ORDER — METOPROLOL SUCCINATE ER 100 MG PO TB24: 100.0000 mg | ORAL_TABLET | Freq: Every day | ORAL | Status: DC

## 2011-12-30 ENCOUNTER — Ambulatory Visit (INDEPENDENT_AMBULATORY_CARE_PROVIDER_SITE_OTHER): Payer: BC Managed Care – PPO | Admitting: Cardiology

## 2011-12-30 ENCOUNTER — Encounter: Payer: Self-pay | Admitting: Cardiology

## 2011-12-30 VITALS — BP 158/102 | HR 80 | Ht 67.0 in | Wt 235.0 lb

## 2011-12-30 DIAGNOSIS — E7800 Pure hypercholesterolemia, unspecified: Secondary | ICD-10-CM

## 2011-12-30 DIAGNOSIS — I10 Essential (primary) hypertension: Secondary | ICD-10-CM

## 2011-12-30 DIAGNOSIS — I2510 Atherosclerotic heart disease of native coronary artery without angina pectoris: Secondary | ICD-10-CM

## 2011-12-30 MED ORDER — ROSUVASTATIN CALCIUM 40 MG PO TABS: 40.0000 mg | ORAL_TABLET | Freq: Every day | ORAL | Status: DC

## 2011-12-30 MED ORDER — METOPROLOL SUCCINATE ER 100 MG PO TB24: 100.0000 mg | ORAL_TABLET | Freq: Every day | ORAL | Status: DC

## 2011-12-30 MED ORDER — CLOPIDOGREL BISULFATE 75 MG PO TABS: 75.0000 mg | ORAL_TABLET | Freq: Every day | ORAL | Status: DC

## 2011-12-30 NOTE — Progress Notes (Signed)
   HPI:  The patient returns in followup. He recently ran out of his medications. He takes his Crestor sparingly, and has not had his blood pressure medicine more than a week. He does have a blood pressure cuff at home and can measure this. He denies any progressive symptoms of cardiac insufficiency. He is here because his pharmacy told him that he would have to come back in order to get his medicines refilled.  Current Outpatient Prescriptions  Medication Sig Dispense Refill  . clopidogrel (PLAVIX) 75 MG tablet Take 1 tablet (75 mg total) by mouth daily.  30 tablet  11  . metoprolol succinate (TOPROL-XL) 100 MG 24 hr tablet Take 1 tablet (100 mg total) by mouth daily. Take with or immediately following a meal.  30 tablet  11  . rosuvastatin (CRESTOR) 40 MG tablet Take 1 tablet (40 mg total) by mouth daily.  30 tablet  11  . DISCONTD: CRESTOR 40 MG tablet TAKE ONE TABLET BY MOUTH EVERY DAY  30 each  6  . DISCONTD: metoprolol succinate (TOPROL-XL) 100 MG 24 hr tablet Take 1 tablet (100 mg total) by mouth daily. Take with or immediately following a meal.  30 tablet  0    Allergies  Allergen Reactions  . Aspirin   . Nsaids     No past medical history on file.  No past surgical history on file.  No family history on file.  History   Social History  . Marital Status: Married    Spouse Name: N/A    Number of Children: N/A  . Years of Education: N/A   Occupational History  . Not on file.   Social History Main Topics  . Smoking status: Never Smoker   . Smokeless tobacco: Never Used  . Alcohol Use: Not on file  . Drug Use: Not on file  . Sexually Active: Not on file   Other Topics Concern  . Not on file   Social History Narrative  . No narrative on file    ROS: Please see the HPI.  All other systems reviewed and negative.  PHYSICAL EXAM:  BP 158/102  Pulse 80  Ht 5\' 7"  (1.702 m)  Wt 235 lb (106.595 kg)  BMI 36.81 kg/m2  General: Well developed, well nourished, in no  acute distress. Head:  Normocephalic and atraumatic. Neck: no JVD Lungs: Clear to auscultation and percussion. Heart: Normal S1 and S2.  No murmur, rubs or gallops.  Abdomen:  Normal bowel sounds; soft; non tender; no organomegaly Pulses: Pulses normal in all 4 extremities. Extremities: No clubbing or cyanosis. No edema. Neurologic: Alert and oriented x 3.  EKG:  NSR.  Possible LAE.  Inferior MI, old.    ASSESSMENT AND PLAN:

## 2011-12-30 NOTE — Patient Instructions (Addendum)
Your physician wants you to follow-up in: 1 YEAR with Dr Clifton James.  You will receive a reminder letter in the mail two months in advance. If you don't receive a letter, please call our office to schedule the follow-up appointment.  Your physician recommends that you continue on your current medications as directed. Please refer to the Current Medication list given to you today.  Your physician recommends that you return for a FASTING LIPID and LIVER profile in 6 WEEKS--nothing to eat or drink after midnight, lab opens at 8:30.  (February 10, 2012)

## 2012-01-04 NOTE — Assessment & Plan Note (Signed)
No recurrent symptoms.

## 2012-01-04 NOTE — Assessment & Plan Note (Signed)
Did not take his meds today.  Has been out.  Will resume.  Have asked him to get his BP checked.

## 2012-01-04 NOTE — Assessment & Plan Note (Addendum)
Has not been taking.  Needs to get lipid and liver checked after resuming his treatment.  Will recheck in six weeks.  Make long term follow up with one of my colleagues in cardiology.

## 2012-02-10 ENCOUNTER — Other Ambulatory Visit (INDEPENDENT_AMBULATORY_CARE_PROVIDER_SITE_OTHER): Payer: BC Managed Care – PPO

## 2012-02-10 DIAGNOSIS — I2510 Atherosclerotic heart disease of native coronary artery without angina pectoris: Secondary | ICD-10-CM

## 2012-02-10 DIAGNOSIS — E7800 Pure hypercholesterolemia, unspecified: Secondary | ICD-10-CM

## 2012-02-10 LAB — LIPID PANEL
Cholesterol: 164 mg/dL (ref 0–200)
LDL Cholesterol: 89 mg/dL (ref 0–99)
Triglycerides: 82 mg/dL (ref 0.0–149.0)
VLDL: 16.4 mg/dL (ref 0.0–40.0)

## 2012-02-10 LAB — HEPATIC FUNCTION PANEL: Total Bilirubin: 0.4 mg/dL (ref 0.3–1.2)

## 2012-02-12 ENCOUNTER — Encounter: Payer: Self-pay | Admitting: Cardiology

## 2012-02-12 NOTE — Telephone Encounter (Signed)
This encounter was created in error - please disregard.

## 2012-02-12 NOTE — Telephone Encounter (Signed)
New problem:  Patient calling for test results. Have permission to leave message on voice mail,

## 2012-06-08 ENCOUNTER — Telehealth: Payer: Self-pay | Admitting: Cardiology

## 2012-06-08 NOTE — Telephone Encounter (Signed)
Spoke with wife, pt c/o HA this morning and the BP readings were as stated prior. According to wife/ pt unavailable to speak with because he left for work, he took his metoprolol XL this morning abt 7:30 am, she thinks he took his med's yesterday. She also states that he took a large dose of  prednisone recently for lip edema, pt also had hotdogs for dinner last night. I told her to make sure he avoids salt and we need further readings to see what his BP is in about an hour when morning med's are in effect. She told me I might be able to reach him if I try now otherwise he is hard to reach. I called and left a MSG for him, warning him to avoid sodium- I listed foods to avoid and gave suggestions of what he could eat today. Told him we need an additional BP readings and to call the office with it, number was provided. I will forward to Dr/Nurse to follow.

## 2012-06-08 NOTE — Telephone Encounter (Signed)
New problem:   C/O blood pressure issues.    190/131  @ 7:55 left arm   185/136 @  8:00 right arm    195/131 @ 8:00 left arm.

## 2012-06-11 NOTE — Telephone Encounter (Signed)
Left message for pt to call back about BP issues.

## 2012-06-29 NOTE — Telephone Encounter (Signed)
I left a message for the pt to contact our office if he has any other questions or concerns related to BP. I will close this encounter.

## 2012-12-31 ENCOUNTER — Other Ambulatory Visit: Payer: Self-pay | Admitting: Cardiology

## 2013-02-23 ENCOUNTER — Encounter: Payer: Self-pay | Admitting: Cardiovascular Disease

## 2013-02-23 ENCOUNTER — Ambulatory Visit (INDEPENDENT_AMBULATORY_CARE_PROVIDER_SITE_OTHER): Payer: BC Managed Care – PPO | Admitting: Cardiovascular Disease

## 2013-02-23 VITALS — BP 174/101 | HR 79 | Ht 67.0 in | Wt 265.0 lb

## 2013-02-23 DIAGNOSIS — I10 Essential (primary) hypertension: Secondary | ICD-10-CM

## 2013-02-23 DIAGNOSIS — E785 Hyperlipidemia, unspecified: Secondary | ICD-10-CM

## 2013-02-23 DIAGNOSIS — I25810 Atherosclerosis of coronary artery bypass graft(s) without angina pectoris: Secondary | ICD-10-CM

## 2013-02-23 MED ORDER — ROSUVASTATIN CALCIUM 40 MG PO TABS: 40.0000 mg | ORAL_TABLET | Freq: Every day | ORAL | Status: DC

## 2013-02-23 MED ORDER — AMLODIPINE BESYLATE 5 MG PO TABS: 5.0000 mg | ORAL_TABLET | Freq: Every day | ORAL | Status: DC

## 2013-02-23 NOTE — Patient Instructions (Signed)
Your physician wants you to follow-up in:  6 months.  You will receive a reminder letter in the mail two months in advance. If you don't receive a letter, please call our office to schedule the follow-up appointment.  Your physician has recommended you make the following change in your medication:  Start amlodipine 5 mg by mouth daily   

## 2013-02-23 NOTE — Progress Notes (Signed)
History of Present Illness: 52 yo male with history of CAD, HTN here today for cardiac follow up. He has been followed in the past by Dr. Riley Kill but has not kept regular follow up appointments. Last seen by Dr. Riley Kill July 2013. Cardiac cath 2007 with 90% diagonal, 50% Circumflex, 100% mid RCA. 2.75 x 16 mm Liberte bare metal stent mid RCA, post-dilated to 3.33mm.   He is here today for follow up. He has been feeling well. No chest pain or SOB. No near syncope or syncope.   Primary Care Physician: None  Last Lipid Profile:Lipid Panel     Component Value Date/Time   CHOL 164 02/10/2012 1105   TRIG 82.0 02/10/2012 1105   HDL 58.70 02/10/2012 1105   CHOLHDL 3 02/10/2012 1105   VLDL 16.4 02/10/2012 1105   LDLCALC 89 02/10/2012 1105     Past Medical History  Diagnosis Date  . HTN (hypertension)   . Coronary atherosclerosis of native coronary artery     MI 2007, bare metal stent mid RCA    Past Surgical History  Procedure Laterality Date  . None      Current Outpatient Prescriptions  Medication Sig Dispense Refill  . cetirizine (ZYRTEC) 10 MG tablet Take 10 mg by mouth daily.      . clopidogrel (PLAVIX) 75 MG tablet TAKE ONE TABLET BY MOUTH EVERY DAY  30 tablet  1  . CRESTOR 40 MG tablet TAKE ONE TABLET BY MOUTH EVERY DAY  30 tablet  1  . diphenhydrAMINE (BENADRYL) 25 MG tablet Take 1 tab as needed for allergic reactions      . metoprolol succinate (TOPROL-XL) 100 MG 24 hr tablet TAKE ONE TABLET BY MOUTH EVERY DAY.  TAKE WITH OR IMMEDIATELY FOLLOWING A MEAL.  30 tablet  1   No current facility-administered medications for this visit.    Allergies  Allergen Reactions  . Aspirin   . Nsaids     History   Social History  . Marital Status: Married    Spouse Name: N/A    Number of Children: 2  . Years of Education: N/A   Occupational History  . [employer]    Social History Main Topics  . Smoking status: Never Smoker   . Smokeless tobacco: Never Used  . Alcohol Use: 21.0  oz/week    42 drink(s) per week  . Drug Use: No  . Sexual Activity: Not on file   Other Topics Concern  . Not on file   Social History Narrative  . No narrative on file    Family History  Problem Relation Age of Onset  . Heart attack Father     Review of Systems:  As stated in the HPI and otherwise negative.   BP 174/101  Pulse 79  Ht 5\' 7"  (1.702 m)  Wt 265 lb (120.203 kg)  BMI 41.5 kg/m2  Physical Examination: General: Well developed, well nourished, NAD HEENT: OP clear, mucus membranes moist SKIN: warm, dry. No rashes. Neuro: No focal deficits Musculoskeletal: Muscle strength 5/5 all ext Psychiatric: Mood and affect normal Neck: No JVD, no carotid bruits, no thyromegaly, no lymphadenopathy. Lungs:Clear bilaterally, no wheezes, rhonci, crackles Cardiovascular: Regular rate and rhythm. No murmurs, gallops or rubs. Abdomen:Soft. Bowel sounds present. Non-tender.  Extremities: No lower extremity edema. Pulses are 2 + in the bilateral DP/PT.  EKG: NSR, rate 77 bpm. Q waves inferior leads.   Assessment and Plan:   1. HTN: BP elevated today. Continue beta blocker. Will  add Norvasc 5 mg po Qdaily.   2. CAD:  Stable. Continue medical management.   3. HYPERCHOLESTEROLEMIA: Lipids controlled last summer. Will continue statin and repeat lipids and LFTs today.

## 2013-02-24 LAB — LIPID PANEL
Cholesterol: 161 mg/dL (ref 0–200)
LDL Cholesterol: 92 mg/dL (ref 0–99)
Triglycerides: 79 mg/dL (ref 0.0–149.0)

## 2013-02-24 LAB — HEPATIC FUNCTION PANEL
AST: 29 U/L (ref 0–37)
Albumin: 4.2 g/dL (ref 3.5–5.2)
Alkaline Phosphatase: 52 U/L (ref 39–117)
Total Protein: 7.1 g/dL (ref 6.0–8.3)

## 2013-03-03 ENCOUNTER — Telehealth: Payer: Self-pay | Admitting: Cardiovascular Disease

## 2013-03-03 NOTE — Telephone Encounter (Signed)
Spoke with pt and reviewed lipid and liver results with him.  

## 2013-03-03 NOTE — Telephone Encounter (Signed)
New Problem ° °Pt calling for results.  °

## 2013-03-11 ENCOUNTER — Other Ambulatory Visit: Payer: Self-pay | Admitting: Cardiovascular Disease

## 2013-03-11 NOTE — Telephone Encounter (Signed)
Refill per pt call

## 2013-06-24 ENCOUNTER — Ambulatory Visit: Payer: BC Managed Care – PPO | Admitting: Emergency Medicine

## 2013-06-24 VITALS — BP 140/90 | HR 67 | Temp 99.2°F | Resp 16 | Ht 67.25 in | Wt 255.0 lb

## 2013-06-24 DIAGNOSIS — R21 Rash and other nonspecific skin eruption: Secondary | ICD-10-CM

## 2013-06-24 DIAGNOSIS — T7840XA Allergy, unspecified, initial encounter: Secondary | ICD-10-CM | POA: Diagnosis not present

## 2013-06-24 DIAGNOSIS — R142 Eructation: Secondary | ICD-10-CM

## 2013-06-24 DIAGNOSIS — R110 Nausea: Secondary | ICD-10-CM

## 2013-06-24 MED ORDER — PREDNISONE 20 MG PO TABS: ORAL_TABLET | ORAL | Status: DC

## 2013-06-24 NOTE — Patient Instructions (Signed)
Please take zantac 150mg  twice a day, continue zyrtec 1 a day, take omeprazole 20mg  1 a day, take prednisone as instructed, call your allergy doctor on Monday.  If you have swelling of your tongue, lips orback of throat please go to emergency room

## 2013-06-24 NOTE — Progress Notes (Signed)
   Subjective:    Patient ID: Luis Mitchell, male    DOB: Oct 31, 1960, 52 y.o.   MRN: [id number]  HPI 52 year old male presents to [facility] with complaints of  1. food getting stuck in his throat.  he can later throw it up Able to drink water but feels like it can come back up Thick mucous in throat x 10 days No burning in esophagus Diarrhea, no stomach pain Non smoker Nauseated this morning  Wakes up at night choking Snores at night Hoarse x 5 days but getting better  2. rash on inner left forearm x 7 days ago Is seen at Dr Willa Rough office for allergies Has not taken prednisone today  Bitten by spider 11 years ago on face Had severe reaction with swollen knot, feet itching, feet began to swell  PCP: Lupe Carney  Review of Systems    Objective:   Physical Exam patient is alert and cooperative he is in no distress. There is no swelling in the posterior pharynx uvula or tongue. There is no swelling of the lips. There is no stridor over the trachea. His lungs were clear heart regular rate no murmurs the abdomen was obese with a midline hernia no masses felt  There is a 9 x 10 cm raised red area over the proximal left forearm. There is a 9 x 10 cm raised red area over the right proximal calf the      Assessment & Plan:  I suspect his symptoms are related to urticaria and allergy. He is to contact his allergist on Monday he will be treated to cover for reflux with Prilosec and Zantac. He will be on prednisone 60 mg for 3 days 40 for 3 days and 20 for 3 days. He will continue on the Zyrtec and will carry his EpiPen with him at all times.

## 2013-06-29 ENCOUNTER — Ambulatory Visit
Admission: RE | Admit: 2013-06-29 | Discharge: 2013-06-29 | Disposition: A | Discharge status: Home / Self Care | Payer: BC Managed Care – PPO | Source: Ambulatory Visit | Attending: Emergency Medicine | Admitting: Emergency Medicine

## 2013-06-29 DIAGNOSIS — R110 Nausea: Secondary | ICD-10-CM

## 2013-06-29 DIAGNOSIS — R142 Eructation: Secondary | ICD-10-CM

## 2013-09-12 ENCOUNTER — Telehealth: Payer: Self-pay | Admitting: *Deleted

## 2013-09-12 ENCOUNTER — Other Ambulatory Visit: Payer: Self-pay | Admitting: Cardiovascular Disease

## 2013-09-12 NOTE — Telephone Encounter (Signed)
[facility] for crestor 40 mg.

## 2013-09-13 NOTE — Telephone Encounter (Signed)
PA through clinical review [facility] approved Crestor 40 mg daily dates 08/23/13-09/13/14, she ran test and it will pay at pharmacy, case ID # [id number]

## 2013-12-20 ENCOUNTER — Other Ambulatory Visit: Payer: Self-pay | Admitting: Cardiovascular Disease

## 2014-01-27 ENCOUNTER — Ambulatory Visit: Payer: BC Managed Care – PPO | Admitting: Physician Assistant

## 2014-01-30 ENCOUNTER — Other Ambulatory Visit: Payer: Self-pay | Admitting: Cardiovascular Disease

## 2014-01-31 ENCOUNTER — Other Ambulatory Visit: Payer: Self-pay

## 2014-01-31 MED ORDER — METOPROLOL SUCCINATE ER 100 MG PO TB24: ORAL_TABLET | ORAL | Status: DC

## 2014-02-02 ENCOUNTER — Encounter: Payer: Self-pay | Admitting: Physician Assistant

## 2014-02-03 ENCOUNTER — Encounter: Payer: Self-pay | Admitting: Physician Assistant

## 2014-02-07 ENCOUNTER — Ambulatory Visit: Payer: BC Managed Care – PPO | Admitting: Physician Assistant

## 2014-02-27 ENCOUNTER — Encounter: Payer: Self-pay | Admitting: Physician Assistant

## 2014-02-27 ENCOUNTER — Ambulatory Visit (INDEPENDENT_AMBULATORY_CARE_PROVIDER_SITE_OTHER): Payer: BC Managed Care – PPO | Admitting: Physician Assistant

## 2014-02-27 VITALS — BP 152/92 | HR 85 | Ht 67.0 in | Wt 257.0 lb

## 2014-02-27 DIAGNOSIS — E7800 Pure hypercholesterolemia, unspecified: Secondary | ICD-10-CM

## 2014-02-27 DIAGNOSIS — I2510 Atherosclerotic heart disease of native coronary artery without angina pectoris: Secondary | ICD-10-CM

## 2014-02-27 DIAGNOSIS — K219 Gastro-esophageal reflux disease without esophagitis: Secondary | ICD-10-CM

## 2014-02-27 DIAGNOSIS — R1310 Dysphagia, unspecified: Secondary | ICD-10-CM

## 2014-02-27 DIAGNOSIS — E785 Hyperlipidemia, unspecified: Secondary | ICD-10-CM

## 2014-02-27 DIAGNOSIS — I10 Essential (primary) hypertension: Secondary | ICD-10-CM

## 2014-02-27 LAB — CBC WITH DIFFERENTIAL/PLATELET
BASOS PCT: 0.1 % (ref 0.0–3.0)
Basophils Absolute: 0 10*3/uL (ref 0.0–0.1)
EOS PCT: 2.8 % (ref 0.0–5.0)
Eosinophils Absolute: 0.2 10*3/uL (ref 0.0–0.7)
HEMATOCRIT: 41.4 % (ref 39.0–52.0)
HEMOGLOBIN: 13.7 g/dL (ref 13.0–17.0)
LYMPHS ABS: 1.2 10*3/uL (ref 0.7–4.0)
Lymphocytes Relative: 15.1 % (ref 12.0–46.0)
MCHC: 33.2 g/dL (ref 30.0–36.0)
MCV: 92.7 fl (ref 78.0–100.0)
MONO ABS: 0.7 10*3/uL (ref 0.1–1.0)
Monocytes Relative: 8.5 % (ref 3.0–12.0)
NEUTROS ABS: 5.7 10*3/uL (ref 1.4–7.7)
Neutrophils Relative %: 73.5 % (ref 43.0–77.0)
PLATELETS: 287 10*3/uL (ref 150.0–400.0)
RBC: 4.46 Mil/uL (ref 4.22–5.81)
RDW: 13.5 % (ref 11.5–15.5)
WBC: 7.8 10*3/uL (ref 4.0–10.5)

## 2014-02-27 LAB — BASIC METABOLIC PANEL
BUN: 19 mg/dL (ref 6–23)
CO2: 24 mEq/L (ref 19–32)
Calcium: 9 mg/dL (ref 8.4–10.5)
Chloride: 104 mEq/L (ref 96–112)
Creatinine, Ser: 0.9 mg/dL (ref 0.4–1.5)
GFR: 98.61 mL/min (ref >60.00)
GLUCOSE: 121 mg/dL — AB (ref 70–99)
POTASSIUM: 4.3 meq/L (ref 3.5–5.1)
Sodium: 136 mEq/L (ref 135–145)

## 2014-02-27 LAB — LIPID PANEL
CHOLESTEROL: 224 mg/dL — AB (ref 0–200)
HDL: 51.3 mg/dL (ref >39.00)
LDL Cholesterol: 153 mg/dL — ABNORMAL HIGH (ref 0–99)
NonHDL: 172.7
TRIGLYCERIDES: 100 mg/dL (ref 0.0–149.0)
Total CHOL/HDL Ratio: 4
VLDL: 20 mg/dL (ref 0.0–40.0)

## 2014-02-27 LAB — HEPATIC FUNCTION PANEL
ALBUMIN: 3.8 g/dL (ref 3.5–5.2)
ALT: 20 U/L (ref 0–53)
AST: 23 U/L (ref 0–37)
Alkaline Phosphatase: 61 U/L (ref 39–117)
BILIRUBIN TOTAL: 0.4 mg/dL (ref 0.2–1.2)
Bilirubin, Direct: 0 mg/dL (ref 0.0–0.3)
TOTAL PROTEIN: 6.9 g/dL (ref 6.0–8.3)

## 2014-02-27 MED ORDER — ROSUVASTATIN CALCIUM 40 MG PO TABS: 40.0000 mg | ORAL_TABLET | Freq: Every day | ORAL | Status: DC

## 2014-02-27 MED ORDER — METOPROLOL SUCCINATE ER 100 MG PO TB24: ORAL_TABLET | ORAL | Status: DC

## 2014-02-27 MED ORDER — NITROGLYCERIN 0.4 MG SL SUBL: 0.4000 mg | SUBLINGUAL_TABLET | SUBLINGUAL | Status: DC | PRN

## 2014-02-27 MED ORDER — CLOPIDOGREL BISULFATE 75 MG PO TABS: ORAL_TABLET | ORAL | Status: DC

## 2014-02-27 MED ORDER — AMLODIPINE BESYLATE 10 MG PO TABS: 10.0000 mg | ORAL_TABLET | Freq: Every day | ORAL | Status: DC

## 2014-02-27 NOTE — Patient Instructions (Signed)
You have been referred to [facility]; DX GERD, DYSPHAGIA  INCREASE AMLODIPINE 10 MG DAILY; NEW RX SENT IN TODAY  LAB WORK TODAY; BMET, CBC W/DIFF, FASTING LIPID AND LIVER PANEL  Your physician wants you to follow-up in: [place] DR. Angelena Form. You will receive a reminder letter in the mail two months in advance. If you don't receive a letter, please call our office to schedule the follow-up appointment.

## 2014-02-27 NOTE — Progress Notes (Signed)
Cardiology Office Note    Date:  02/27/2014   ID:  TAGEN MILBY, DOB May 23, 1961, MRN [id number]  PCP:  No primary provider on file.  Cardiologist:  Dr. Lauree Chandler      History of Present Illness: Luis Mitchell is a 53 y.o. male with a hx of CAD s/p inf MI 2007 tx with BMS to RCA, HTN, HL.  Last seen by Dr. Lauree Chandler 01/2013.  He returns for FU.  The patient denies chest pain, shortness of breath, syncope, orthopnea, PND or significant pedal edema.    Studies:  - LHC (12/07):  EF 50%, diagonal 90% with subbranch 70-80%, mid circumflex 50%, RCA occluded, distal RCA 30%.  >>> PCI: BMS to the RCA, diagonal treated medically  - Echo (12/07):  EF 50-55%, inferoposterior hypokinesis, trivial MR, moderate LAE, minimal effusion versus epicardial fat this tissue   Recent Labs/Images: No results found for requested labs within last 365 days.   Wt Readings from Last 3 Encounters:  02/27/14 257 lb (116.574 kg)  06/24/13 255 lb (115.667 kg)  02/23/13 265 lb (120.203 kg)     Past Medical History  Diagnosis Date  . HTN (hypertension)   . Coronary atherosclerosis of native coronary artery     MI 2007, bare metal stent mid RCA    Current Outpatient Prescriptions  Medication Sig Dispense Refill  . amLODipine (NORVASC) 5 MG tablet Take 1 tablet (5 mg total) by mouth daily.  30 tablet  11  . cetirizine (ZYRTEC) 10 MG tablet Take 10 mg by mouth daily.      . clopidogrel (PLAVIX) 75 MG tablet TAKE ONE TABLET BY MOUTH ONCE DAILY (PATIENT  NEEDS  TO  CONTACT  OFFICE  TO  SCHEDULE  AN  APPOINTMENT  FOR  FUTURE  REFILLS)  30 tablet  0  . diphenhydrAMINE (BENADRYL) 25 MG tablet Take 1 tab as needed for allergic reactions      . metoprolol succinate (TOPROL-XL) 100 MG 24 hr tablet TAKE ONE TABLET BY MOUTH ONCE DAILY TAKE  WITH  OR  IMMEDIATELY  FOLLOWING  A  MEAL  30 tablet  0  . rosuvastatin (CRESTOR) 40 MG tablet Take 1 tablet (40 mg total) by mouth daily.  30 tablet  11     No current facility-administered medications for this visit.     Allergies:   Aspirin and Nsaids   Social History:  The patient  reports that he has never smoked. He has never used smokeless tobacco. He reports that he drinks about 21 ounces of alcohol per week. He reports that he does not use illicit drugs.   Family History:  The patient's family history includes Heart attack in his father.   ROS:  Please see the history of present illness.   He has had symptoms of dysphagia for about 8 mos.  No weight loss.  No odynophagia.  No melena, hematochezia, hematemesis.  All other systems reviewed and negative.   PHYSICAL EXAM: VS:  BP 152/92  Pulse 85  Ht 5\' 7"  (1.702 m)  Wt 257 lb (116.574 kg)  BMI 40.24 kg/m2 Well nourished, well developed, in no acute distress HEENT: normal Neck: no JVD Vascular:  No carotid bruits. Cardiac:  normal S1, S2; RRR; no murmur Lungs:  clear to auscultation bilaterally, no wheezing, rhonchi or rales Abd: soft, nontender, no hepatomegaly Ext: no edema Skin: warm and dry Neuro:  CNs 2-12 intact, no focal abnormalities noted  EKG:  NSR, HR  85, inf Q waves, NSSTTW changes, no change from prior tracing     ASSESSMENT AND PLAN:  CORONARY ARTERY DISEASE:  No angina.  Continue Plavix, statin, beta blocker.    HYPERTENSION, UNSPECIFIED:  Uncontrolled.  Check BMET.  Increase Amlodipine 10 mg QD.    HYPERCHOLESTEROLEMIA:  Continue statin. Check Lipids and LFTs.  DYSPHAGIA:  He saw his allergist who gave him a PPI without much benefit.  No alarm symptoms noted.  I will refer him to GI.     Disposition:  FU with Dr. Lauree Chandler in 6 mos.    Signed, Versie Starks, MHS 02/27/2014 9:24 AM    [facility] [address], [place], [place]  [address] Phone: [phone]; Fax: [phone]

## 2014-02-28 ENCOUNTER — Telehealth: Payer: Self-pay | Admitting: *Deleted

## 2014-02-28 NOTE — Telephone Encounter (Signed)
lmptcb for lab results 

## 2014-03-02 ENCOUNTER — Encounter: Payer: Self-pay | Admitting: *Deleted

## 2014-03-10 ENCOUNTER — Telehealth: Payer: Self-pay | Admitting: *Deleted

## 2014-03-10 ENCOUNTER — Encounter: Payer: Self-pay | Admitting: Physician Assistant

## 2014-03-10 ENCOUNTER — Ambulatory Visit (INDEPENDENT_AMBULATORY_CARE_PROVIDER_SITE_OTHER): Payer: BC Managed Care – PPO | Admitting: Physician Assistant

## 2014-03-10 VITALS — BP 162/110 | HR 78 | Ht 66.5 in | Wt 250.2 lb

## 2014-03-10 DIAGNOSIS — Z7901 Long term (current) use of anticoagulants: Secondary | ICD-10-CM

## 2014-03-10 DIAGNOSIS — R1310 Dysphagia, unspecified: Secondary | ICD-10-CM

## 2014-03-10 DIAGNOSIS — R1110 Vomiting, unspecified: Secondary | ICD-10-CM

## 2014-03-10 DIAGNOSIS — I2510 Atherosclerotic heart disease of native coronary artery without angina pectoris: Secondary | ICD-10-CM

## 2014-03-10 DIAGNOSIS — IMO0001 Reserved for inherently not codable concepts without codable children: Secondary | ICD-10-CM

## 2014-03-10 NOTE — Telephone Encounter (Signed)
03/10/2014   RE: ALIJA RIANO DOB: 06/17/1961 MRN: [id number]   Dear Dr.Christopher Angelena Form,    We have scheduled the above patient for an endoscopic procedure. Our records show that he is on anticoagulation therapy.   Please advise as to how long the patient may come off his therapy of Plavix prior to the procedure, which is scheduled for 03-22-2014.  Please fax back/ or route the completed form to [facility] at [phone].   Sincerely,    Amy Esterwood PA-C

## 2014-03-10 NOTE — Telephone Encounter (Signed)
He can come off of his Plavix 5 days before the procedure. No recent coronary stents.   Darlina Guys

## 2014-03-10 NOTE — Patient Instructions (Signed)

## 2014-03-10 NOTE — Progress Notes (Addendum)
Subjective:    Patient ID: Luis Mitchell, male    DOB: Apr 09, 1961, 53 y.o.   MRN: [id number]  HPI  Luis Mitchell is a pleasant 53 year old white male referred today per Dr. McAlhaney/cardiology for evaluation of dysphagia. Patient has not had any prior GI evaluation. Patient does have history of coronary artery disease he status post MI in 2007 and had stents placed to the RCA. He has been maintained on Plavix. He also has history of hypertension hyperlipidemia and obesity. Patient says he is been having problems with dysphagia over the past 9 months. He says he was ill last year around Christmas time with a respiratory type infection and says he started noticing after that, that he had trouble with swallowing. He says his food started feeling like it was sticking or sitting in his chest and he also began having some episodes of regurgitation. He went to his primary care doctor was treated for GERD and also for allergies. He was then sent to an allergist because he was also complaining of a lot of mucus and phlegm in his throat. He says basically he was told that "he is allergic to [facility]". He is now on Zyrtec and says says that happened help some of his allergy symptoms but he continues to have a lot of mucus and phlegm that just seems to sit in his esophagus. He says sometimes even with liquids the liquid will go down but a few minutes later he may regurgitate some of it back up his esophagus. With solid food at times he feels his food is sticking ,andother times he'll be able to eat a meal and then may have some regurgitation afterwards. He has no complaints of heartburn or abdominal pain. No persistent cough or mucus production. No odynophagia. His appetite has been fine and his weight has been stable. He has not had prior colon screening and has no current complaints. Family history is negative for colon cancer polyps.    Review of Systems  Constitutional: Negative.   HENT: Positive for trouble  swallowing.   Eyes: Negative.   Respiratory: Negative.   Cardiovascular: Negative.   Gastrointestinal: Negative.   Endocrine: Negative.   Genitourinary: Negative.   Musculoskeletal: Negative.   Allergic/Immunologic: Negative.   Neurological: Negative.   Hematological: Negative.   Psychiatric/Behavioral: Negative.    Outpatient Prescriptions Prior to Visit  Medication Sig Dispense Refill  . amLODipine (NORVASC) 10 MG tablet Take 1 tablet (10 mg total) by mouth daily.  30 tablet  11  . cetirizine (ZYRTEC) 10 MG tablet Take 10 mg by mouth daily.      . clopidogrel (PLAVIX) 75 MG tablet 1 TABLET DAILY  30 tablet  11  . diphenhydrAMINE (BENADRYL) 25 MG tablet Take 1 tab as needed for allergic reactions      . metoprolol succinate (TOPROL-XL) 100 MG 24 hr tablet TAKE ONE TABLET BY MOUTH ONCE DAILY TAKE  WITH  OR  IMMEDIATELY  FOLLOWING  A  MEAL  30 tablet  11  . nitroGLYCERIN (NITROSTAT) 0.4 MG SL tablet Place 1 tablet (0.4 mg total) under the tongue every 5 (five) minutes as needed for chest pain.  25 tablet  3  . rosuvastatin (CRESTOR) 40 MG tablet Take 1 tablet (40 mg total) by mouth daily.  30 tablet  11   No facility-administered medications prior to visit.   Allergies  Allergen Reactions  . Aspirin   . Nsaids       Patient Active Problem  List   Diagnosis Date Noted  . HYPERCHOLESTEROLEMIA 10/23/2009  . OBESITY, MODERATE 10/23/2009  . HYPERTENSION, UNSPECIFIED 10/23/2009  . MYOCARDIAL INFARCTION, HX OF 10/23/2009  . CORONARY ARTERY DISEASE 10/23/2009   History   Social History Narrative  . No narrative on file   family history includes Heart attack in his father. There is no history of Colon cancer, Colon polyps, Esophageal cancer, or Kidney disease.  Objective:   Physical Exam  well-developed white male in no acute distress, pleasant blood pressure 162/110 pulse 78 height 5 foot 6 weight 250, BMI 39.7. HEENT ;nontraumatic normocephalic EOMI PERRLA sclera anicteric,  Supple; no JVD, Cardiovascular; regular rate and rhythm with S1-S2 no murmur or gallop, Pulmonary ;clear bilaterally, Abdomen; obese soft nontender nondistended bowel sounds are active there is no palpable mass or hepatosplenomegaly, Rectal; exam not done, Extremities; no clubbing cyanosis or edema skin warm and dry, Psych ;mood and affect appropriate      Assessment & Plan:  #62  53 year old white male with 9 month history of intermittent dysphagia and increased phlegm/mucus noted in his throat. Patient has had intermittent episodes of regurgitation. Will need to rule out esophageal motility disorder versus stricture. His symptoms are not entirely consistent with a stricture. #2 coronary artery disease status post MI 2007-status post RCA stent #3 chronic antiplatelet therapy-on Plavix #4 hypertension #5 obesity #6 colon neoplasia surveillance patient has not had colonoscopy  Plan; Patient will be scheduled for upper endoscopy and possible esophageal dilation with Dr. Hilarie Fredrickson. Procedures discussed in detail with the patient and he is agreeable to proceed. We will obtain consent from his cardiologist Dr. Julianne Handler  for him to hold Plavix for 5-7 days prior to his procedure Discussed screening colonoscopy-patient prefers to deal with esophageal issues first and then will think about colonoscopy.   Addendum: Reviewed and agree with initial management. Screening colonoscopy recommended and pt defers for now Jerene Bears, MD

## 2014-03-14 ENCOUNTER — Telehealth: Payer: Self-pay | Admitting: Internal Medicine

## 2014-03-14 NOTE — Telephone Encounter (Signed)
Pt called the wrong office, pts PCP had called him regarding lab results.

## 2014-03-14 NOTE — Telephone Encounter (Signed)
pt notified about lab results and said he had not been taking the crestor on a regular basis, but just re-started. I advised that I will d/w Brynda Rim. PA and advised pt that he may want to re-check FLP/LFT in about 8 wks. I advised pt I will cb later.

## 2014-03-14 NOTE — Telephone Encounter (Signed)
Ok- please let pt know- day 6 off plavix  Should be procedure day

## 2014-03-15 NOTE — Telephone Encounter (Signed)
I called the patient and I left a message to stop Plavix on 9-18 and resume it on 9-24. I asked him to call me if he has any questions.

## 2014-03-16 ENCOUNTER — Telehealth: Payer: Self-pay | Admitting: *Deleted

## 2014-03-16 DIAGNOSIS — E7800 Pure hypercholesterolemia, unspecified: Secondary | ICD-10-CM

## 2014-03-16 NOTE — Telephone Encounter (Signed)
lmptcb to schedule repeat FLP/LFT to be done in 8 weeks. Orders in placed in computer today.

## 2014-03-16 NOTE — Telephone Encounter (Signed)
I called and left another message with his Plavix instructions for the colonoscopy. He is to stop the Plavix on 9-18 and resume it on 03-23-2014. I advised him to call us if he has any questions.

## 2014-03-20 ENCOUNTER — Encounter: Payer: Self-pay | Admitting: *Deleted

## 2014-03-20 ENCOUNTER — Telehealth: Payer: Self-pay | Admitting: *Deleted

## 2014-03-20 NOTE — Telephone Encounter (Signed)
lmptcb to schedule his FLP/LFT

## 2014-03-22 ENCOUNTER — Ambulatory Visit (AMBULATORY_SURGERY_CENTER): Payer: BC Managed Care – PPO | Admitting: Internal Medicine

## 2014-03-22 ENCOUNTER — Encounter: Payer: Self-pay | Admitting: Internal Medicine

## 2014-03-22 VITALS — BP 140/88 | HR 62 | Temp 97.2°F | Resp 19 | Ht 66.5 in | Wt 250.0 lb

## 2014-03-22 DIAGNOSIS — R1310 Dysphagia, unspecified: Secondary | ICD-10-CM

## 2014-03-22 MED ORDER — SODIUM CHLORIDE 0.9 % IV SOLN: 500.0000 mL | INTRAVENOUS | Status: DC

## 2014-03-22 NOTE — Progress Notes (Signed)
Per Dr. Hilarie Fredrickson, pt is to restart Plavix 03-23-14

## 2014-03-22 NOTE — Op Note (Signed)
[facility]  [address] [place], [id number]   ENDOSCOPY PROCEDURE REPORT  PATIENT: Luis, Mitchell  MR#: [id number] BIRTHDATE: Apr 26, 1961 , 39  yrs. old GENDER: male ENDOSCOPIST: Jerene Bears, MD PROCEDURE DATE:  03/22/2014 PROCEDURE:  EGD w/ biopsy and Savary dilation of esophagus ASA CLASS:     Class III INDICATIONS:  dysphagia. MEDICATIONS: Monitored anesthesia care, Propofol (Diprivan) 270 mg IV, and Glycopyrrolate (Robinul) 0.2 mg IV TOPICAL ANESTHETIC: none  DESCRIPTION OF PROCEDURE: After the risks benefits and alternatives of the procedure were thoroughly explained, informed consent was obtained.  The LB [id number] endoscope was introduced through the mouth and advanced to the second portion of the duodenum , limited by Without limitations. The instrument was slowly withdrawn as the mucosa was fully examined.  ESOPHAGUS: Frothy secretions throughout the esophagus raising suspicion of motility disorder.  Regular Z-line without evidence of stricture, though moderate resistance was required to pass the LES. The mucosa of the esophagus appeared normal.  Biopsies were taken in the distal and mid esophagus for eosinophilic esophagitis. Empiric dilation was performed with Savary over a guidewire, one pass, 17 mm Savary, no resistance or heme noted.  STOMACH: There was mild antral gastropathy noted.  Cold forcep biopsies were taken at the body, antrum and angularis.  DUODENUM: The duodenal mucosa showed no abnormalities in the bulb and 2nd part of the duodenum.  Retroflexed views revealed no abnormalities.     The scope was then withdrawn from the patient and the procedure completed.  COMPLICATIONS: There were no complications.  ENDOSCOPIC IMPRESSION: 1.   Frothy secretions throughout the esophagus.  Regular Z-line without evidence of stricture, though moderate resistance was required to pass the LES 2.   The mucosa of the esophagus appeared  normal; Biopsies were taken in the distal and mid esophagus for eosinophilic esophagitis 3.   There was mild antral gastropathy noted; multiple biopsies 4.   The duodenal mucosa showed no abnormalities in the bulb and 2nd part of the duodenum  RECOMMENDATIONS: 1.  Await pathology results 2.  If symptoms fail to improve with dilation, then esophageal manometry should be performed to exclude motility disorder such as achalasia  eSigned:  Jerene Bears, MD 03/22/2014 8:36 AM    CC:The Patient, Donnie Coffin, MD, and Verl Bangs, MD  PATIENT NAME:  [name], [name] MR#: [id number]

## 2014-03-22 NOTE — Progress Notes (Signed)
Procedure ends, to recovery, report given and VSS. 

## 2014-03-22 NOTE — Progress Notes (Signed)
Called to room to assist during endoscopic procedure.  Patient ID and intended procedure confirmed with present staff. Received instructions for my participation in the procedure from the performing physician.  

## 2014-03-22 NOTE — Patient Instructions (Signed)
YOU HAD AN ENDOSCOPIC PROCEDURE TODAY AT [facility]: Refer to the procedure report that was given to you for any specific questions about what was found during the examination.  If the procedure report does not answer your questions, please call your gastroenterologist to clarify.  If you requested that your care partner not be given the details of your procedure findings, then the procedure report has been included in a sealed envelope for you to review at your convenience later.  YOU SHOULD EXPECT: Some feelings of bloating in the abdomen. Passage of more gas than usual.  Walking can help get rid of the air that was put into your GI tract during the procedure and reduce the bloating  DIET: FOLLOW DILATION DIET- SEE HANDOUT Drink plenty of fluids but you should avoid alcoholic beverages for 24 hours.  ACTIVITY: Your care partner should take you home directly after the procedure.  You should plan to take it easy, moving slowly for the rest of the day.  You can resume normal activity the day after the procedure however you should NOT DRIVE or use heavy machinery for 24 hours (because of the sedation medicines used during the test).    SYMPTOMS TO REPORT IMMEDIATELY: A gastroenterologist can be reached at any hour.  During normal business hours, 8:30 AM to 5:00 PM Monday through Friday, call [phone].  After hours and on weekends, please call the GI answering service at [phone] who will take a message and have the physician on call contact you.   Following upper endoscopy (EGD)  Vomiting of blood or coffee ground material  New chest pain or pain under the shoulder blades  Painful or persistently difficult swallowing  New shortness of breath  Fever of 100F or higher  Black, tarry-looking stools  FOLLOW UP: If any biopsies were taken you will be contacted by phone or by letter within the next 1-3 weeks.  Call your gastroenterologist if you have not heard about the  biopsies in 3 weeks.  Our staff will call the home number listed on your records the next business day following your procedure to check on you and address any questions or concerns that you may have at that time regarding the information given to you following your procedure. This is a courtesy call and so if there is no answer at the home number and we have not heard from you through the emergency physician on call, we will assume that you have returned to your regular daily activities without incident.  SIGNATURES/CONFIDENTIALITY: You and/or your care partner have signed paperwork which will be entered into your electronic medical record.  These signatures attest to the fact that that the information above on your After Visit Summary has been reviewed and is understood.  Full responsibility of the confidentiality of this discharge information lies with you and/or your care-partner.  FOLLOW DILATION DIET- SEE HANDOUT  AWAIT PATHOLOGY  CONTINUE NORMAL MEDICATIONS- RESTART PLAVIX TOMORROW (03-23-14)

## 2014-03-23 ENCOUNTER — Telehealth: Payer: Self-pay | Admitting: *Deleted

## 2014-03-23 NOTE — Telephone Encounter (Signed)
No answer, left message to call if questions or concerns. 

## 2014-03-27 ENCOUNTER — Encounter: Payer: Self-pay | Admitting: Internal Medicine

## 2014-04-18 ENCOUNTER — Other Ambulatory Visit (INDEPENDENT_AMBULATORY_CARE_PROVIDER_SITE_OTHER): Payer: BC Managed Care – PPO | Admitting: *Deleted

## 2014-04-18 ENCOUNTER — Telehealth: Payer: Self-pay | Admitting: *Deleted

## 2014-04-18 DIAGNOSIS — I10 Essential (primary) hypertension: Secondary | ICD-10-CM

## 2014-04-18 DIAGNOSIS — E7800 Pure hypercholesterolemia, unspecified: Secondary | ICD-10-CM

## 2014-04-18 DIAGNOSIS — I2510 Atherosclerotic heart disease of native coronary artery without angina pectoris: Secondary | ICD-10-CM

## 2014-04-18 LAB — HEPATIC FUNCTION PANEL
ALBUMIN: 3.7 g/dL (ref 3.5–5.2)
ALT: 25 U/L (ref 0–53)
AST: 23 U/L (ref 0–37)
Alkaline Phosphatase: 58 U/L (ref 39–117)
Bilirubin, Direct: 0 mg/dL (ref 0.0–0.3)
Total Bilirubin: 0.5 mg/dL (ref 0.2–1.2)
Total Protein: 7.2 g/dL (ref 6.0–8.3)

## 2014-04-18 LAB — LIPID PANEL
Cholesterol: 159 mg/dL (ref 0–200)
HDL: 53.7 mg/dL (ref >39.00)
LDL CALC: 88 mg/dL (ref 0–99)
NonHDL: 105.3
Total CHOL/HDL Ratio: 3
Triglycerides: 86 mg/dL (ref 0.0–149.0)
VLDL: 17.2 mg/dL (ref 0.0–40.0)

## 2014-04-18 MED ORDER — CLOPIDOGREL BISULFATE 75 MG PO TABS: ORAL_TABLET | ORAL | Status: DC

## 2014-04-18 MED ORDER — AMLODIPINE BESYLATE 10 MG PO TABS: 10.0000 mg | ORAL_TABLET | Freq: Every day | ORAL | Status: DC

## 2014-04-18 MED ORDER — METOPROLOL SUCCINATE ER 100 MG PO TB24: ORAL_TABLET | ORAL | Status: DC

## 2014-04-18 NOTE — Telephone Encounter (Signed)
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 Refills sent in today per wife request

## 2014-08-29 ENCOUNTER — Encounter: Payer: Self-pay | Admitting: Cardiovascular Disease

## 2014-08-29 ENCOUNTER — Ambulatory Visit (INDEPENDENT_AMBULATORY_CARE_PROVIDER_SITE_OTHER): Payer: BLUE CROSS/BLUE SHIELD | Admitting: Cardiovascular Disease

## 2014-08-29 VITALS — BP 144/90 | HR 87 | Ht 66.5 in | Wt 249.0 lb

## 2014-08-29 DIAGNOSIS — I10 Essential (primary) hypertension: Secondary | ICD-10-CM

## 2014-08-29 DIAGNOSIS — E785 Hyperlipidemia, unspecified: Secondary | ICD-10-CM

## 2014-08-29 DIAGNOSIS — I2510 Atherosclerotic heart disease of native coronary artery without angina pectoris: Secondary | ICD-10-CM

## 2014-08-29 MED ORDER — ROSUVASTATIN CALCIUM 40 MG PO TABS: 40.0000 mg | ORAL_TABLET | Freq: Every day | ORAL | Status: DC

## 2014-08-29 MED ORDER — HYDROCHLOROTHIAZIDE 25 MG PO TABS: 25.0000 mg | ORAL_TABLET | Freq: Every day | ORAL | Status: DC

## 2014-08-29 MED ORDER — PANTOPRAZOLE SODIUM 40 MG PO TBEC: 40.0000 mg | DELAYED_RELEASE_TABLET | Freq: Every day | ORAL | Status: DC

## 2014-08-29 MED ORDER — AMLODIPINE BESYLATE 10 MG PO TABS: 10.0000 mg | ORAL_TABLET | Freq: Every day | ORAL | Status: DC

## 2014-08-29 MED ORDER — CLOPIDOGREL BISULFATE 75 MG PO TABS: ORAL_TABLET | ORAL | Status: DC

## 2014-08-29 MED ORDER — METOPROLOL SUCCINATE ER 100 MG PO TB24: ORAL_TABLET | ORAL | Status: DC

## 2014-08-29 NOTE — Progress Notes (Signed)
History of Present Illness: 54 yo male with history of CAD, HTN here today for cardiac follow up. He has been followed in the past by Dr. Lia Foyer. I saw him in the office in 2014. Cardiac cath 2007 with 90% diagonal, 50% Circumflex, 100% mid RCA. 2.75 x 16 mm Liberte bare metal stent mid RCA, post-dilated to 3.29mm.   He is here today for follow up. He has been feeling well. No chest pain or SOB. No near syncope or syncope. He is working full time. Taking medications as prescribed. BP has been 779T systolic at home.   Primary Care Physician: Donnie Coffin  Last Lipid Profile:Lipid Panel     Component Value Date/Time   CHOL 159 04/18/2014 0845   TRIG 86.0 04/18/2014 0845   HDL 53.70 04/18/2014 0845   CHOLHDL 3 04/18/2014 0845   VLDL 17.2 04/18/2014 0845   [place] 88 04/18/2014 0845     Past Medical History  Diagnosis Date  . HTN (hypertension)   . Coronary atherosclerosis of native coronary artery     MI 2007, bare metal stent mid RCA  . Hyperlipidemia   . Myocardial infarction 06/20/2006    Past Surgical History  Procedure Laterality Date  . None    . Coronary stent placement      Current Outpatient Prescriptions  Medication Sig Dispense Refill  . amLODipine (NORVASC) 10 MG tablet Take 1 tablet (10 mg total) by mouth daily. 90 tablet 3  . cetirizine (ZYRTEC) 10 MG tablet Take 10 mg by mouth daily.    . clopidogrel (PLAVIX) 75 MG tablet 1 TABLET DAILY 90 tablet 3  . diphenhydrAMINE (BENADRYL) 25 MG tablet Take 1 tab as needed for allergic reactions    . metoprolol succinate (TOPROL-XL) 100 MG 24 hr tablet TAKE ONE TABLET BY MOUTH ONCE DAILY TAKE  WITH  OR  IMMEDIATELY  FOLLOWING  A  MEAL 90 tablet 3  . nitroGLYCERIN (NITROSTAT) 0.4 MG SL tablet Place 1 tablet (0.4 mg total) under the tongue every 5 (five) minutes as needed for chest pain. 25 tablet 3  . pantoprazole (PROTONIX) 40 MG tablet Take 40 mg by mouth daily.    . rosuvastatin (CRESTOR) 40 MG tablet Take 1 tablet  (40 mg total) by mouth daily. 30 tablet 11   No current facility-administered medications for this visit.    Allergies  Allergen Reactions  . Aspirin   . Nsaids     History   Social History  . Marital Status: Married    Spouse Name: N/A  . Number of Children: 2  . Years of Education: N/A   Occupational History  . Carpenter    Social History Main Topics  . Smoking status: Light Tobacco Smoker    Types: Cigars  . Smokeless tobacco: Never Used  . Alcohol Use: 21.0 oz/week    42 drink(s) per week     Comment: Drinks 4 per day  . Drug Use: No  . Sexual Activity: Not on file   Other Topics Concern  . Not on file   Social History Narrative    Family History  Problem Relation Age of Onset  . Heart attack Father   . Colon cancer Neg Hx   . Colon polyps Neg Hx   . Esophageal cancer Neg Hx   . Kidney disease Neg Hx     Review of Systems:  As stated in the HPI and otherwise negative.   BP 144/90 mmHg  Pulse 87  Ht  5' 6.5" (1.689 m)  Wt 249 lb (112.946 kg)  BMI 39.59 kg/m2  SpO2 97%  Physical Examination: General: Well developed, well nourished, NAD HEENT: OP clear, mucus membranes moist SKIN: warm, dry. No rashes. Neuro: No focal deficits Musculoskeletal: Muscle strength 5/5 all ext Psychiatric: Mood and affect normal Neck: No JVD, no carotid bruits, no thyromegaly, no lymphadenopathy. Lungs:Clear bilaterally, no wheezes, rhonci, crackles Cardiovascular: Regular rate and rhythm. No murmurs, gallops or rubs. Abdomen:Soft. Bowel sounds present. Non-tender.  Extremities: No lower extremity edema. Pulses are 2 + in the bilateral DP/PT.  Assessment and Plan:   1. HTN: BP elevated today. Continue beta blocker and Norvasc. Will add HCTT 25 mg daily.    2. CAD:  He has had no recent chest pain. He has no recent ischemic testing. Will arrange exercise stress myoview to exclude ischemia. Continue Plavix, statin, beta blocker.   3. HYPERCHOLESTEROLEMIA: Lipids  controlled. LDL goal of 70. Encouraged to alter diet and increase exercise. Will continue statin.

## 2014-08-29 NOTE — Patient Instructions (Signed)
Your physician wants you to follow-up in:  6 months. You will receive a reminder letter in the mail two months in advance. If you don't receive a letter, please call our office to schedule the follow-up appointment.  Your physician has requested that you have an exercise stress myoview. For further information please visit [email or web address]. Please follow instruction sheet, as given.   Your physician has recommended you make the following change in your medication: Start hydrochlorothiazide 25 mg by mouth daily.

## 2014-09-01 ENCOUNTER — Encounter: Payer: Self-pay | Admitting: Cardiology

## 2014-09-07 ENCOUNTER — Ambulatory Visit (HOSPITAL_COMMUNITY): Payer: BLUE CROSS/BLUE SHIELD | Attending: Cardiovascular Disease | Admitting: Radiology

## 2014-09-07 DIAGNOSIS — I10 Essential (primary) hypertension: Secondary | ICD-10-CM | POA: Diagnosis not present

## 2014-09-07 DIAGNOSIS — I2510 Atherosclerotic heart disease of native coronary artery without angina pectoris: Secondary | ICD-10-CM

## 2014-09-07 MED ORDER — TECHNETIUM TC 99M SESTAMIBI GENERIC - CARDIOLITE
11.0000 | Freq: Once | INTRAVENOUS | Status: AC | PRN
  Administered 2014-09-07: 11 via INTRAVENOUS

## 2014-09-07 MED ORDER — TECHNETIUM TC 99M SESTAMIBI GENERIC - CARDIOLITE
33.0000 | Freq: Once | INTRAVENOUS | Status: AC | PRN
  Administered 2014-09-07: 33 via INTRAVENOUS

## 2014-09-07 NOTE — Progress Notes (Signed)
[facility] [address] [place], [place] [address] [phone]    Cardiology Nuclear Med Study  Luis Mitchell is a 54 y.o. male     MRN : [id number]     DOB: 1961-04-03  Procedure Date: 09/07/2014  Nuclear Med Background Indication for Stress Test:  Evaluation for Ischemia History:  CAD-Stents 2007 Cardiac Risk Factors: Hypertension  Symptoms:  n/a   Nuclear Pre-Procedure Caffeine/Decaff Intake:  None NPO After: 7:00pm   Lungs:  clear O2 Sat: 98% on room air. IV 0.9% NS with Angio Cath:  22g  IV Site: R Forearm  IV Started by:  Crissie Figures, RN  Chest Size (in):  50 Cup Size: n/a  Height: 5' 6.5" (1.689 m)  Weight:  246 lb (111.585 kg)  BMI:  Body mass index is 39.12 kg/(m^2). Tech Comments:  N/A    Nuclear Med Study 1 or 2 day study: 1 day  Stress Test Type:  Stress  Reading MD: N/A  Order Authorizing Provider:  Lauree Chandler, MD  Resting Radionuclide: Technetium 58m Sestamibi  Resting Radionuclide Dose: 11.0 mCi   Stress Radionuclide:  Technetium 53m Sestamibi  Stress Radionuclide Dose: 33.0 mCi           Stress Protocol Rest HR: 71 Stress HR: 151  Rest BP: 156/92 Stress BP: 182/94  Exercise Time (min): 6:00 METS: 7.0   Predicted Max HR: 166 bpm % Max HR: 90.96 bpm Rate Pressure Product: 66440   Dose of Adenosine (mg):  n/a Dose of Lexiscan: n/a mg  Dose of Atropine (mg): n/a Dose of Dobutamine: n/a mcg/kg/min (at max HR)  Stress Test Technologist: Perrin Maltese, EMT-P  Nuclear Technologist:  Earl Many, CNMT     Rest Procedure:  Myocardial perfusion imaging was performed at rest 45 minutes following the intravenous administration of Technetium 64m Sestamibi. Rest ECG: NSR - Normal EKG, occasional PVCs.   Stress Procedure:  The patient exercised on the treadmill utilizing the Bruce Protocol for 6:00 minutes. The patient stopped due to fatigue and denied any chest pain.  Technetium 52m Sestamibi was injected at  peak exercise and myocardial perfusion imaging was performed after a brief delay. Stress ECG: No significant change from baseline ECG  QPS Raw Data Images:  Normal; no motion artifact; normal heart/lung ratio. Stress Images:  There is a large, severe defect involving the inferior, inferiolateral base  and apical walls.   Rest Images:  There is a large, severe defect involving the inferior. inferorlateral base  and apical walls.  Subtraction (SDS):  No evidence of ischemia.  There is evidence of a large inferior MI extending from the apex to base and also involving the basal inferolateral wall.  Transient Ischemic Dilatation (Normal <1.22):  0.95 Lung/Heart Ratio (Normal <0.45):  0.35  Quantitative Gated Spect Images QGS EDV:  205 ml QGS ESV:  128 ml  Impression Exercise Capacity:  Fair exercise capacity. BP Response:  Normal blood pressure response. Clinical Symptoms:  No significant symptoms noted. ECG Impression:  No significant ST segment change suggestive of ischemia. Comparison with Prior Nuclear Study: No images to compare  Overall Impression:  Low risk stress nuclear study .  There is evidence of a previous Inferior MI ( which has been known)  There is no ischemia.  .  LV Ejection Fraction: 37%.  LV Wall Motion:  Severe hypokinesis of the inferior wall.     Thayer Headings, Brooke Bonito., MD, [facility] 09/07/2014, 3:16 PM [address]  [place] Pager [phone]

## 2014-09-12 ENCOUNTER — Other Ambulatory Visit: Payer: Self-pay | Admitting: *Deleted

## 2014-09-12 DIAGNOSIS — I2510 Atherosclerotic heart disease of native coronary artery without angina pectoris: Secondary | ICD-10-CM

## 2014-09-18 ENCOUNTER — Ambulatory Visit (HOSPITAL_COMMUNITY): Payer: BLUE CROSS/BLUE SHIELD | Attending: Cardiology

## 2014-09-18 DIAGNOSIS — I2510 Atherosclerotic heart disease of native coronary artery without angina pectoris: Secondary | ICD-10-CM | POA: Insufficient documentation

## 2014-09-18 NOTE — Progress Notes (Signed)
2D Echo completed. 09/18/2014 

## 2015-06-21 ENCOUNTER — Ambulatory Visit (INDEPENDENT_AMBULATORY_CARE_PROVIDER_SITE_OTHER): Payer: BLUE CROSS/BLUE SHIELD | Admitting: Family Medicine

## 2015-06-21 VITALS — BP 114/76 | HR 77 | Temp 98.1°F | Resp 18 | Ht 67.0 in | Wt 250.0 lb

## 2015-06-21 DIAGNOSIS — J0100 Acute maxillary sinusitis, unspecified: Secondary | ICD-10-CM

## 2015-06-21 MED ORDER — AMOXICILLIN-POT CLAVULANATE 875-125 MG PO TABS: 1.0000 | ORAL_TABLET | Freq: Two times a day (BID) | ORAL | Status: DC

## 2015-06-21 MED ORDER — PREDNISONE 20 MG PO TABS: ORAL_TABLET | ORAL | Status: DC

## 2015-06-21 NOTE — Patient Instructions (Addendum)
You can use Afrin (oxymetazoline) spray for several nights so you can breathe  continue the saline nasal spray as well.   it often helps get in a ht steamy shower for a while to oosen things p.   Sinusitis, Adult Sinusitis is redness, soreness, and inflammation of the paranasal sinuses. Paranasal sinuses are air pockets within the bones of your face. They are located beneath your eyes, in the middle of your forehead, and above your eyes. In healthy paranasal sinuses, mucus is able to drain out, and air is able to circulate through them by way of your nose. However, when your paranasal sinuses are inflamed, mucus and air can become trapped. This can allow bacteria and other germs to grow and cause infection. Sinusitis can develop quickly and last only a short time (acute) or continue over a long period (chronic). Sinusitis that lasts for more than 12 weeks is considered chronic. CAUSES Causes of sinusitis include:  Allergies.  Structural abnormalities, such as displacement of the cartilage that separates your nostrils (deviated septum), which can decrease the air flow through your nose and sinuses and affect sinus drainage.  Functional abnormalities, such as when the small hairs (cilia) that line your sinuses and help remove mucus do not work properly or are not present. SIGNS AND SYMPTOMS Symptoms of acute and chronic sinusitis are the same. The primary symptoms are pain and pressure around the affected sinuses. Other symptoms include:  Upper toothache.  Earache.  Headache.  Bad breath.  Decreased sense of smell and taste.  A cough, which worsens when you are lying flat.  Fatigue.  Fever.  Thick drainage from your nose, which often is green and may contain pus (purulent).  Swelling and warmth over the affected sinuses. DIAGNOSIS Your health care provider will perform a physical exam. During your exam, your health care provider may perform any of the following to help determine  if you have acute sinusitis or chronic sinusitis:  Look in your nose for signs of abnormal growths in your nostrils (nasal polyps).  Tap over the affected sinus to check for signs of infection.  View the inside of your sinuses using an imaging device that has a light attached (endoscope). If your health care provider suspects that you have chronic sinusitis, one or more of the following tests may be recommended:  Allergy tests.  Nasal culture. A sample of mucus is taken from your nose, sent to a lab, and screened for bacteria.  Nasal cytology. A sample of mucus is taken from your nose and examined by your health care provider to determine if your sinusitis is related to an allergy. TREATMENT Most cases of acute sinusitis are related to a viral infection and will resolve on their own within 10 days. Sometimes, medicines are prescribed to help relieve symptoms of both acute and chronic sinusitis. These may include pain medicines, decongestants, nasal steroid sprays, or saline sprays. However, for sinusitis related to a bacterial infection, your health care provider will prescribe antibiotic medicines. These are medicines that will help kill the bacteria causing the infection. Rarely, sinusitis is caused by a fungal infection. In these cases, your health care provider will prescribe antifungal medicine. For some cases of chronic sinusitis, surgery is needed. Generally, these are cases in which sinusitis recurs more than 3 times per year, despite other treatments. HOME CARE INSTRUCTIONS  Drink plenty of water. Water helps thin the mucus so your sinuses can drain more easily.  Use a humidifier.  Inhale steam 3-4  times a day (for example, sit in the bathroom with the shower running).  Apply a warm, moist washcloth to your face 3-4 times a day, or as directed by your health care provider.  Use saline nasal sprays to help moisten and clean your sinuses.  Take medicines only as directed by your  health care provider.  If you were prescribed either an antibiotic or antifungal medicine, finish it all even if you start to feel better. SEEK IMMEDIATE MEDICAL CARE IF:  You have increasing pain or severe headaches.  You have nausea, vomiting, or drowsiness.  You have swelling around your face.  You have vision problems.  You have a stiff neck.  You have difficulty breathing.   This information is not intended to replace advice given to you by your health care provider. Make sure you discuss any questions you have with your health care provider.   Document Released: 06/16/2005 Document Revised: 07/07/2014 Document Reviewed: 07/01/2011 Elsevier Interactive Patient Education [employer].

## 2015-06-21 NOTE — Progress Notes (Signed)
By signing my name below, I, Moises Blood, attest that this documentation has been prepared under the direction and in the presence of Robyn Haber, MD. Electronically Signed: Moises Blood, [place]. 06/21/2015 , 8:33 PM .  Patient was seen in room 7 .   Patient ID: Luis Mitchell MRN: [id number], DOB: 1961/01/18, 54 y.o. Date of Encounter: 06/21/2015  Primary Physician: Donnie Coffin, MD  Chief Complaint:  Chief Complaint  Patient presents with   Sinusitis    x 2 weeks    HPI:  Luis Mitchell is a 54 y.o. male who presents to [facility] complaining of sinus pressure with congestion noticed 2 weeks ago. He's been having some yellow mucus. He's been having trouble sleeping at night. He's taken some prednisone for relief, but he only had 2 pills left. He's been using saline nasal spray for temporary relief. He denies smoking. He denies usual sinus problems.   He's a Contractor.   Past Medical History  Diagnosis Date   HTN (hypertension)    Coronary atherosclerosis of native coronary artery     MI 2007, bare metal stent mid RCA   Hyperlipidemia    Myocardial infarction ([place]) 06/20/2006     Home Meds: Prior to Admission medications   Medication Sig Start Date End Date Taking? Authorizing Provider  amLODipine (NORVASC) 10 MG tablet Take 1 tablet (10 mg total) by mouth daily. 08/29/14 09/02/15 Yes Burnell Blanks, MD  cetirizine (ZYRTEC) 10 MG tablet Take 10 mg by mouth daily.   Yes Historical Provider, MD  clopidogrel (PLAVIX) 75 MG tablet 1 TABLET DAILY 08/29/14  Yes Burnell Blanks, MD  diphenhydrAMINE (BENADRYL) 25 MG tablet Take 1 tab as needed for allergic reactions   Yes Historical Provider, MD  metoprolol succinate (TOPROL-XL) 100 MG 24 hr tablet TAKE ONE TABLET BY MOUTH ONCE DAILY TAKE  WITH  OR  IMMEDIATELY  FOLLOWING  A  MEAL 08/29/14  Yes Burnell Blanks, MD  nitroGLYCERIN (NITROSTAT) 0.4 MG SL tablet Place 1 tablet  (0.4 mg total) under the tongue every 5 (five) minutes as needed for chest pain. 02/27/14  Yes Scott Joylene Draft, PA-C  hydrochlorothiazide (HYDRODIURIL) 25 MG tablet Take 1 tablet (25 mg total) by mouth daily. Patient not taking: Reported on 06/21/2015 08/29/14   Burnell Blanks, MD  pantoprazole (PROTONIX) 40 MG tablet Take 1 tablet (40 mg total) by mouth daily. Patient not taking: Reported on 06/21/2015 08/29/14   Burnell Blanks, MD  rosuvastatin (CRESTOR) 40 MG tablet Take 1 tablet (40 mg total) by mouth daily. Patient not taking: Reported on 06/21/2015 08/29/14   Burnell Blanks, MD    Allergies:  Allergies  Allergen Reactions   Aspirin    Nsaids     Social History   Social History   Marital Status: Married    Spouse Name: N/A   Number of Children: 2   Years of Education: N/A   Occupational History   Carpenter    Social History Main Topics   Smoking status: Light Tobacco Smoker    Types: Cigars   Smokeless tobacco: Never Used   Alcohol Use: 21.0 oz/week    42 drink(s) per week     Comment: Drinks 4 per day   Drug Use: No   Sexual Activity: Not on file   Other Topics Concern   Not on file   Social History Narrative     Review of Systems: Constitutional: negative for fever,  chills, night sweats, weight changes, or fatigue  HEENT: negative for vision changes, hearing loss, rhinorrhea, ST, epistaxis; positive for sinus pressure, nasal congestion Cardiovascular: negative for chest pain or palpitations Respiratory: negative for hemoptysis, wheezing, shortness of breath, or cough Abdominal: negative for abdominal pain, nausea, vomiting, diarrhea, or constipation Dermatological: negative for rash Neurologic: negative for headache, dizziness, or syncope All other systems reviewed and are otherwise negative with the exception to those above and in the HPI.  Physical Exam: Blood pressure 114/76, pulse 77, temperature 98.1 F (36.7 C), resp.  rate 18, height 5\' 7"  (1.702 m), weight 250 lb (113.399 kg), SpO2 99 %., Body mass index is 39.15 kg/(m^2). General: Well developed, well nourished, in no acute distress. Head: Normocephalic, atraumatic, eyes without discharge, sclera non-icteric. Bilateral auditory canals clear, TM's are without perforation, pearly grey and translucent with reflective cone of light bilaterally. Oral cavity moist, posterior pharynx without exudate, erythema, peritonsillar abscess, or post nasal drip. Thick mucus purulent drainage in both nasal passages Neck: Supple. No thyromegaly. Full ROM. No lymphadenopathy. Lungs: Clear bilaterally to auscultation without wheezes, rales, or rhonchi. Breathing is unlabored.  Heart: RRR with S1 S2. No murmurs, rubs, or gallops appreciated. Msk:  Strength and tone normal for age. Extremities/Skin: Warm and dry. No clubbing or cyanosis. No edema. No rashes or suspicious lesions. Neuro: Alert and oriented X 3. Moves all extremities spontaneously. Gait is normal. CNII-XII grossly in tact. Psych:  Responds to questions appropriately with a normal affect.    ASSESSMENT AND PLAN:  54 y.o. year old male with sinusitis  This chart was scribed in my presence and reviewed by me personally.    ICD-9-CM ICD-10-CM   1. Acute maxillary sinusitis, recurrence not specified 461.0 J01.00 predniSONE (DELTASONE) 20 MG tablet     amoxicillin-clavulanate (AUGMENTIN) 875-125 MG tablet    Signed, Robyn Haber, MD 06/21/2015 8:33 PM

## 2015-08-20 ENCOUNTER — Ambulatory Visit (INDEPENDENT_AMBULATORY_CARE_PROVIDER_SITE_OTHER): Payer: BLUE CROSS/BLUE SHIELD | Admitting: Family Medicine

## 2015-08-20 VITALS — BP 132/78 | HR 82 | Temp 98.0°F | Resp 16 | Ht 68.0 in | Wt 263.0 lb

## 2015-08-20 DIAGNOSIS — J329 Chronic sinusitis, unspecified: Secondary | ICD-10-CM

## 2015-08-20 MED ORDER — MOMETASONE FUROATE 50 MCG/ACT NA SUSP: 2.0000 | Freq: Every day | NASAL | Status: DC

## 2015-08-20 MED ORDER — CLARITHROMYCIN ER 500 MG PO TB24: 1000.0000 mg | ORAL_TABLET | Freq: Every day | ORAL | Status: DC

## 2015-08-20 MED ORDER — IPRATROPIUM BROMIDE 0.03 % NA SOLN: 2.0000 | Freq: Two times a day (BID) | NASAL | Status: DC

## 2015-08-20 NOTE — Patient Instructions (Addendum)
Drink plenty of fluids and get enough rest  Take the prednisone 20 mg pills daily for 5 days, then 1 daily for 5 days, then discontinue  Use the mometasone nose spray 2 sprays each nostril twice daily for 4 days, then decrease to once daily  Use the Atrovent nasal spray 2 sprays each nostril 4 times daily if needed for congestion  Stay.using the Afrin  Take clarithromycin 2 pills daily for 10 days for infection.

## 2015-08-20 NOTE — Progress Notes (Signed)
Patient ID: Luis Mitchell, male    DOB: [birth year]  Age: 55 y.o. MRN: [id number]  Chief Complaint  Patient presents with  . Sinusitis  . Shortness of Breath    Subjective:   Patient is here complaining of sinusitis he has had episodes recurrently apparently. It is been somewhat chronic this winter. He has taken some prednisone intermittently because he is given refills on it previously. He does not smoke. He does work as a Games developer. He has a lot of head congestion with purulent mucus draining. He is not coughing a lot but he does have shortness of breath.  Current allergies, medications, problem list, past/family and social histories reviewed.  Objective:  BP 132/78 mmHg  Pulse 82  Temp(Src) 98 F (36.7 C)  Resp 16  Ht 5\' 8"  (1.727 m)  Wt 263 lb (119.296 kg)  BMI 40.00 kg/m2  SpO2 99%  No major acute stress. His TMs are normal. Nose congested. Throat clear. Neck supple without significant nodes. Chest is clear to auscultation. Heart regular with a soft systolic murmur.  Assessment & Plan:   Assessment: 1. Chronic sinusitis, unspecified location       Plan: See instructions. He is taking Augmentin, then took a few of his wife's, so I'm changing him to Biaxin. Meds ordered this encounter  Medications  . mometasone (NASONEX) 50 MCG/ACT nasal spray    Sig: Place 2 sprays into the nose daily.    Dispense:  17 g    Refill:  12  . clarithromycin (BIAXIN XL) 500 MG 24 hr tablet    Sig: Take 2 tablets (1,000 mg total) by mouth daily.    Dispense:  20 tablet    Refill:  0  . ipratropium (ATROVENT) 0.03 % nasal spray    Sig: Place 2 sprays into both nostrils 2 (two) times daily.    Dispense:  30 mL    Refill:  0         Patient Instructions  Drink plenty of fluids and get enough rest  Take the prednisone 20 mg pills daily for 5 days, then 1 daily for 5 days, then discontinue  Use the mometasone nose spray 2 sprays each nostril twice daily for 4 days, then  decrease to once daily  Use the Atrovent nasal spray 2 sprays each nostril 4 times daily if needed for congestion  Stay.using the Afrin  Take clarithromycin 2 pills daily for 10 days for infection.     Return if symptoms worsen or fail to improve.   Rotunda Worden, MD 08/20/2015

## 2015-10-01 ENCOUNTER — Telehealth: Payer: Self-pay | Admitting: Cardiovascular Disease

## 2015-10-01 NOTE — Telephone Encounter (Signed)
Advised that they be evaluated at walk in clinic/urgent care to evaluate cough. Informed that it has been more than a year since seeing Dr. Angelena Form and no advisement can be made. Asked patient to make appt w/ [facility] as he is overdue, explained at last OV 08/2014 recommended 6 mo follow up. Patient will call office back to schedule this.

## 2015-10-01 NOTE — Telephone Encounter (Signed)
[phone] pt's wife calling to request cough med for pt-told her to call PCP-they have not seen him in over 8 years so they won't call in anything-they don't have any soon appts and was told to go to walk in clinic , they don't want to do that-wants to see if Dr. Julianne Handler will call something in-not seen since 08-2014 uses [facility] [place]-pls call

## 2015-10-03 ENCOUNTER — Other Ambulatory Visit: Payer: Self-pay | Admitting: Cardiovascular Disease

## 2015-11-22 ENCOUNTER — Other Ambulatory Visit: Payer: Self-pay | Admitting: Cardiovascular Disease

## 2015-12-21 ENCOUNTER — Other Ambulatory Visit: Payer: Self-pay | Admitting: Cardiovascular Disease

## 2016-03-13 NOTE — Progress Notes (Signed)
Chief Complaint  Patient presents with  . Coronary Artery Disease   History of Present Illness: 55 yo male with history of CAD, HTN here today for cardiac follow up. He has been followed in the past by Dr. Lia Foyer. I saw him in the office in 2014. Cardiac cath 2007 with 90% diagonal, 50% Circumflex, 100% mid RCA. 2.75 x 16 mm Liberte bare metal stent mid RCA. He has had no cardiac cath since 2007. Nuclear stress test March 2016 with no ischemia. Echo March 2016 with LVEF=45-50%, no significant valve disease.   He is here today for follow up. He has been feeling well. No chest pain or SOB. No near syncope or syncope. He is working full time. Taking medications as prescribed.    Primary Care Physician: Donnie Coffin, MD   Past Medical History:  Diagnosis Date  . Coronary atherosclerosis of native coronary artery    MI 2007, bare metal stent mid RCA  . HTN (hypertension)   . Hyperlipidemia   . Myocardial infarction [facility]) 06/20/2006    Past Surgical History:  Procedure Laterality Date  . CORONARY STENT PLACEMENT    . None      Current Outpatient Prescriptions  Medication Sig Dispense Refill  . amLODipine (NORVASC) 10 MG tablet Take 1 tablet (10 mg total) by mouth daily. 30 tablet 11  . clopidogrel (PLAVIX) 75 MG tablet Take 1 tablet (75 mg total) by mouth daily. 30 tablet 11  . diphenhydrAMINE (BENADRYL) 25 MG tablet Take 1 tab as needed for allergic reactions    . ipratropium (ATROVENT) 0.03 % nasal spray Place 2 sprays into both nostrils 2 (two) times daily. 30 mL 0  . metoprolol succinate (TOPROL-XL) 100 MG 24 hr tablet Take 1 tablet (100 mg total) by mouth daily. 30 tablet 11  . mometasone (NASONEX) 50 MCG/ACT nasal spray Place 2 sprays into the nose daily. 17 g 12   No current facility-administered medications for this visit.     Allergies  Allergen Reactions  . Aspirin Swelling  . Nsaids Swelling    Social History   Social History  . Marital status: Married   Spouse name: N/A  . Number of children: 2  . Years of education: N/A   Occupational History  . Carpenter    Social History Main Topics  . Smoking status: Light Tobacco Smoker    Types: Cigars  . Smokeless tobacco: Never Used  . Alcohol use 21.0 oz/week    42 drink(s) per week     Comment: Drinks 4 per day  . Drug use: No  . Sexual activity: Not on file   Other Topics Concern  . Not on file   Social History Narrative  . No narrative on file    Family History  Problem Relation Age of Onset  . Heart attack Father   . Colon cancer Neg Hx   . Colon polyps Neg Hx   . Esophageal cancer Neg Hx   . Kidney disease Neg Hx     Review of Systems:  As stated in the HPI and otherwise negative.   BP (!) 150/90   Pulse 86   Ht 5\' 8"  (1.727 m)   Wt 271 lb 6.4 oz (123.1 kg)   BMI 41.27 kg/m   Physical Examination: General: Well developed, well nourished, NAD  HEENT: OP clear, mucus membranes moist  SKIN: warm, dry. No rashes. Neuro: No focal deficits  Musculoskeletal: Muscle strength 5/5 all ext  Psychiatric: Mood and affect  normal  Neck: No JVD, no carotid bruits, no thyromegaly, no lymphadenopathy.  Lungs:Clear bilaterally, no wheezes, rhonci, crackles Cardiovascular: Regular rate and rhythm. No murmurs, gallops or rubs. Abdomen:Soft. Bowel sounds present. Non-tender.  Extremities: No lower extremity edema. Pulses are 2 + in the bilateral DP/PT.  Echo March 2016: Left ventricle: The endocardial segments are not well visualized making wall motion abnormalities difficult to assess. Recommend definity contrast study. The cavity size was normal. There was mild concentric hypertrophy. Systolic function was mildly reduced. The estimated ejection fraction was in the range of 45% to 50%. There was an increased relative contribution of atrial contraction to ventricular filling. Doppler parameters are consistent with abnormal left ventricular relaxation (grade  1 diastolic dysfunction). - Aortic valve: Trileaflet; normal thickness, mildly calcified leaflets. - Aorta: Aortic root dimension: 38.7 mm (ED). - Ascending aorta: The ascending aorta was mildly dilated. - Mitral valve: There was trivial regurgitation. - Atrial septum: There was increased thickness of the septum, consistent with lipomatous hypertrophy. - Pulmonic valve: There was trivial regurgitation.  EKG:  EKG is ordered today. The ekg ordered today demonstrates NSR, rate 86 bpm. Inferior Q waves  Recent Labs: No results found for requested labs within last 8760 hours.   Lipid Panel    Component Value Date/Time   CHOL 159 04/18/2014 0845   TRIG 86.0 04/18/2014 0845   HDL 53.70 04/18/2014 0845   CHOLHDL 3 04/18/2014 0845   VLDL 17.2 04/18/2014 0845   LDLCALC 88 04/18/2014 0845   LDLDIRECT 152.7 10/24/2009 0000     Wt Readings from Last 3 Encounters:  03/14/16 271 lb 6.4 oz (123.1 kg)  08/20/15 263 lb (119.3 kg)  06/21/15 250 lb (113.4 kg)     Other studies Reviewed: Additional studies/ records that were reviewed today include: . Review of the above records demonstrates:   Assessment and Plan:   1. HTN: BP is elevated this am. I had started HCTZ at the last visit but he is not taking. He says his BP is good at home. He will check this next week and call with readings. Continue beta blocker and Norvasc.   2. CAD:  He has had no recent chest pain. Nuclear stress test March 2016 with no ischemia. LVEF=45-50% by echo march 2016. Continue Plavix, statin, beta blocker.   3. HYPERCHOLESTEROLEMIA: He stopped taking Crestor a year ago due to muscle aches. Will repeat lipids and LFTs now. Will start Crestor, dosing based on lipid results. Will likely start 10 mg per day.   Current medicines are reviewed at length with the patient today.  The patient does not have concerns regarding medicines.  The following changes have been made:  no change  Labs/ tests ordered today  include:   Orders Placed This Encounter  Procedures  . Lipid Profile  . Hepatic function panel    Disposition:   FU with me in 12 months  Signed, Lauree Chandler, MD 03/14/2016 9:38 AM    [facility] [address], [place], [place]  [address] Phone: [phone]; Fax: [phone]

## 2016-03-14 ENCOUNTER — Ambulatory Visit (INDEPENDENT_AMBULATORY_CARE_PROVIDER_SITE_OTHER): Payer: BLUE CROSS/BLUE SHIELD | Admitting: Cardiovascular Disease

## 2016-03-14 ENCOUNTER — Encounter: Payer: Self-pay | Admitting: Cardiovascular Disease

## 2016-03-14 VITALS — BP 150/90 | HR 86 | Ht 68.0 in | Wt 271.4 lb

## 2016-03-14 DIAGNOSIS — I2510 Atherosclerotic heart disease of native coronary artery without angina pectoris: Secondary | ICD-10-CM

## 2016-03-14 DIAGNOSIS — E785 Hyperlipidemia, unspecified: Secondary | ICD-10-CM | POA: Diagnosis not present

## 2016-03-14 DIAGNOSIS — I10 Essential (primary) hypertension: Secondary | ICD-10-CM | POA: Diagnosis not present

## 2016-03-14 LAB — HEPATIC FUNCTION PANEL
ALT: 20 U/L (ref 9–46)
AST: 19 U/L (ref 10–35)
Albumin: 4.3 g/dL (ref 3.6–5.1)
Alkaline Phosphatase: 61 U/L (ref 40–115)
BILIRUBIN INDIRECT: 0.3 mg/dL (ref 0.2–1.2)
Bilirubin, Direct: 0.1 mg/dL (ref <=0.2)
TOTAL PROTEIN: 6.7 g/dL (ref 6.1–8.1)
Total Bilirubin: 0.4 mg/dL (ref 0.2–1.2)

## 2016-03-14 LAB — LIPID PANEL
CHOL/HDL RATIO: 4 ratio (ref <=5.0)
Cholesterol: 228 mg/dL — ABNORMAL HIGH (ref 125–200)
HDL: 57 mg/dL (ref >=40)
LDL CALC: 155 mg/dL — AB (ref <130)
TRIGLYCERIDES: 80 mg/dL (ref <150)
VLDL: 16 mg/dL (ref <30)

## 2016-03-14 MED ORDER — CLOPIDOGREL BISULFATE 75 MG PO TABS: 75.0000 mg | ORAL_TABLET | Freq: Every day | ORAL | 11 refills | Status: DC

## 2016-03-14 MED ORDER — METOPROLOL SUCCINATE ER 100 MG PO TB24: 100.0000 mg | ORAL_TABLET | Freq: Every day | ORAL | 11 refills | Status: DC

## 2016-03-14 MED ORDER — AMLODIPINE BESYLATE 10 MG PO TABS: 10.0000 mg | ORAL_TABLET | Freq: Every day | ORAL | 11 refills | Status: DC

## 2016-03-14 NOTE — Patient Instructions (Addendum)
Medication Instructions:  Your physician recommends that you continue on your current medications as directed. Please refer to the Current Medication list given to you today.   Labwork: Lab work to be done today--Lipid and Liver profiles  Testing/Procedures: none  Follow-Up: Your physician wants you to follow-up in: 12 months.  You will receive a reminder letter in the mail two months in advance. If you don't receive a letter, please call our office to schedule the follow-up appointment.   Any Other Special Instructions Will Be Listed Below (If Applicable).     If you need a refill on your cardiac medications before your next appointment, please call your pharmacy.   

## 2016-03-17 ENCOUNTER — Other Ambulatory Visit: Payer: Self-pay

## 2016-03-17 ENCOUNTER — Other Ambulatory Visit: Payer: Self-pay | Admitting: *Deleted

## 2016-03-17 DIAGNOSIS — E7800 Pure hypercholesterolemia, unspecified: Secondary | ICD-10-CM

## 2016-03-17 MED ORDER — ROSUVASTATIN CALCIUM 20 MG PO TABS: 20.0000 mg | ORAL_TABLET | Freq: Every day | ORAL | 11 refills | Status: DC

## 2016-06-06 ENCOUNTER — Telehealth: Payer: Self-pay | Admitting: Cardiovascular Disease

## 2016-06-06 NOTE — Telephone Encounter (Signed)
I spoke with pt and made him aware of fasting lab work scheduled for 06/09/16.

## 2016-06-06 NOTE — Telephone Encounter (Signed)
New message ° ° ° ° °Returning a call to the nurse °

## 2016-06-09 ENCOUNTER — Other Ambulatory Visit: Payer: BLUE CROSS/BLUE SHIELD | Admitting: *Deleted

## 2016-06-09 DIAGNOSIS — E7800 Pure hypercholesterolemia, unspecified: Secondary | ICD-10-CM

## 2016-06-09 LAB — LIPID PANEL
CHOLESTEROL: 153 mg/dL (ref <200)
HDL: 55 mg/dL (ref >40)
LDL Cholesterol: 83 mg/dL (ref <100)
Total CHOL/HDL Ratio: 2.8 Ratio (ref <5.0)
Triglycerides: 76 mg/dL (ref <150)
VLDL: 15 mg/dL (ref <30)

## 2016-06-09 LAB — HEPATIC FUNCTION PANEL
ALT: 19 U/L (ref 9–46)
AST: 17 U/L (ref 10–35)
Albumin: 4.3 g/dL (ref 3.6–5.1)
Alkaline Phosphatase: 63 U/L (ref 40–115)
Bilirubin, Direct: 0.1 mg/dL (ref <=0.2)
Indirect Bilirubin: 0.4 mg/dL (ref 0.2–1.2)
Total Bilirubin: 0.5 mg/dL (ref 0.2–1.2)
Total Protein: 6.5 g/dL (ref 6.1–8.1)

## 2016-06-11 ENCOUNTER — Telehealth: Payer: Self-pay | Admitting: Cardiovascular Disease

## 2016-06-11 NOTE — Telephone Encounter (Signed)
I spoke with pt and reviewed lipid and liver lab results with him.

## 2016-06-11 NOTE — Telephone Encounter (Signed)
Returning your call  .. Thanks  °

## 2016-08-22 ENCOUNTER — Other Ambulatory Visit: Payer: Self-pay | Admitting: Family Medicine

## 2016-08-22 DIAGNOSIS — J329 Chronic sinusitis, unspecified: Secondary | ICD-10-CM

## 2016-08-23 ENCOUNTER — Ambulatory Visit (INDEPENDENT_AMBULATORY_CARE_PROVIDER_SITE_OTHER): Payer: BLUE CROSS/BLUE SHIELD | Admitting: Family Medicine

## 2016-08-23 VITALS — BP 132/84 | HR 97 | Temp 98.9°F | Resp 18 | Ht 67.0 in | Wt 270.0 lb

## 2016-08-23 DIAGNOSIS — Z20818 Contact with and (suspected) exposure to other bacterial communicable diseases: Secondary | ICD-10-CM | POA: Diagnosis not present

## 2016-08-23 MED ORDER — MUPIROCIN 2 % EX OINT: TOPICAL_OINTMENT | CUTANEOUS | 0 refills | Status: DC

## 2016-08-23 NOTE — Progress Notes (Signed)
   Luis Mitchell is a 56 y.o. male who presents to Primary Care at [facility] today for to be tested for MRSA:  1.  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 States that she was tested for MRSA while in the hospital and came back positive. He was told he should be evaluated for MRSA himself. He is here today for that.  He does have history of 8-10 months ago a boil that did not need to be lanced but resolved on its own. Does not have history of recurrent boils or infections. He is otherwise doing well currently without complaints. No fevers or chills. No abdominal pain. No skin lesions. He does have long-term sebaceous cyst of the back of his neck for years but this is not red or swollen or painful.  No nasal drainage.   ROS as above.    PMH reviewed. Patient is a nonsmoker.   Past Medical History:  Diagnosis Date  . Coronary atherosclerosis of native coronary artery    MI 2007, bare metal stent mid RCA  . HTN (hypertension)   . Hyperlipidemia   . Myocardial infarction 06/20/2006   Past Surgical History:  Procedure Laterality Date  . CORONARY STENT PLACEMENT    . None      Medications reviewed. Current Outpatient Prescriptions  Medication Sig Dispense Refill  . amLODipine (NORVASC) 10 MG tablet Take 1 tablet (10 mg total) by mouth daily. 30 tablet 11  . clopidogrel (PLAVIX) 75 MG tablet Take 1 tablet (75 mg total) by mouth daily. 30 tablet 11  . diphenhydrAMINE (BENADRYL) 25 MG tablet Take 1 tab as needed for allergic reactions    . metoprolol succinate (TOPROL-XL) 100 MG 24 hr tablet Take 1 tablet (100 mg total) by mouth daily. 30 tablet 11  . mometasone (NASONEX) 50 MCG/ACT nasal spray USE TWO SPRAY(S) IN EACH NOSTRIL ONCE DAILY 17 g 2  . ipratropium (ATROVENT) 0.03 % nasal spray Place 2 sprays into both nostrils 2 (two) times daily. (Patient not taking: Reported on 08/23/2016) 30 mL 0  . rosuvastatin  (CRESTOR) 20 MG tablet Take 1 tablet (20 mg total) by mouth daily. 30 tablet 11   No current facility-administered medications for this visit.      Physical Exam:  BP 132/84   Pulse 97   Temp 98.9 F (37.2 C)   Resp 18   Ht 5\' 7"  (1.702 m)   Wt 270 lb (122.5 kg)   SpO2 98%   BMI 42.29 kg/m  Gen:  Patient sitting on exam table, appears stated age in no acute distress Head: Normocephalic atraumatic Eyes: EOMI, PERRL, sclera and conjunctiva non-erythematous Ears:  Canals clear bilaterally.  TMs pearly gray bilaterally without erythema or bulging.   Nose:  Nasal WNL BL.  No exudates Mouth: Mucosa membranes moist. Tonsils +2, nonenlarged, non-erythematous.   Assessment and Plan:  1.  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  No evidence of infection.  History of a boil in past year.  - wife undergoing abdominal surgery.   - generally do not need to attempt eradication of non-hospitalized, community dweller -- but with wife undergoing surgery, will treat with mupirocin - good hand hygiene.   - FU if any sign of infections.

## 2016-08-23 NOTE — Patient Instructions (Addendum)
We're not able to do the nasal swabs here like at the hospital, but we will get you treated anyway.   Use the Mupirocin ointment on your nose twice daily for 5 days.  Make sure to wash you hands and use sanitizer like you have been.  It was good to see you today   IF you received an x-ray today, you will receive an invoice from [facility]. Please contact [facility] at [phone] with questions or concerns regarding your invoice.   IF you received labwork today, you will receive an invoice from [facility]. Please contact [facility] at [phone] with questions or concerns regarding your invoice.   Our billing staff will not be able to assist you with questions regarding bills from these companies.  You will be contacted with the lab results as soon as they are available. The fastest way to get your results is to activate your My Chart account. Instructions are located on the last page of this paperwork. If you have not heard from [place] regarding the results in 2 weeks, please contact this office.

## 2017-04-11 ENCOUNTER — Other Ambulatory Visit: Payer: Self-pay | Admitting: Cardiovascular Disease

## 2017-04-11 DIAGNOSIS — E7800 Pure hypercholesterolemia, unspecified: Secondary | ICD-10-CM

## 2017-04-17 ENCOUNTER — Encounter: Payer: Self-pay | Admitting: Physician Assistant

## 2017-05-04 ENCOUNTER — Encounter: Payer: Self-pay | Admitting: Physician Assistant

## 2017-05-04 DIAGNOSIS — I77810 Thoracic aortic ectasia: Secondary | ICD-10-CM | POA: Insufficient documentation

## 2017-05-04 NOTE — Progress Notes (Signed)
Cardiology Office Note    Date:  05/05/2017  ID:  Luis Mitchell, DOB Aug 28, 1960, MRN [id number] PCP:  Alroy Dust, Carlean Jews.Marlou Sa, MD  Cardiologist:  Angelena Form   Chief Complaint: f/u CAD  History of Present Illness:  Luis Mitchell is a 56 y.o. male with history of CAD (MI s/p BMS to m[facility]CA 2007 with residual 90% diagonal, 50% Cx disease, nonischemic 08/2014), chronic cardiomyopathy with EF 45-50% in 08/2014, mildly dilated ascending aorta, HTN, HLD who presents for annual f/u. No cath since PCI in 2007. Last nuc 08/2014 showed low risk; prior MI, no ischemia, EF 37%. 2D echo 08/2014 not well visualized, estimated EF 45-50%, grade 1 DD, mildly dilated ascending aorta. (Of note prior EF 50-55% by echo in 2007.) Last labs showed LDL 83 and normal LFTs (05/2016), last CBC on file from 2015 was normal along with glucose 121 and Cr 0.9.  He returns for 1 year follow-up today by himself. He has been doing well. No chest pain, dyspnea on exertion, edema, syncope or palpitations. Has had issues with allergies this year with mucus in his throat upon waking. He reports emergence of multiple allergies after a spider bite years ago. Wife has been in and out of medical care due to GI obstructions. He's gained several pounds and is aware of the need to lose weight.   Past Medical History:  Diagnosis Date  . Cardiomyopathy ([place])    a. EF 45-50% by echo 08/2014.  Marland Kitchen Coronary atherosclerosis of native coronary artery    a. MI s/p BMS to [facility] 2007 with residual 90% diagonal, 50% Cx disease. b. stress test 08/2014 without ischemia.  Marland Kitchen HTN (hypertension)   . Hyperlipidemia   . Mild dilation of ascending aorta (HCC)   . Myocardial infarction [facility]) 06/20/2006    Past Surgical History:  Procedure Laterality Date  . CORONARY STENT PLACEMENT    . None      Current Medications: Current Meds  Medication Sig  . amLODipine (NORVASC) 10 MG tablet TAKE ONE TABLET BY MOUTH ONCE DAILY  . clopidogrel (PLAVIX) 75 MG tablet TAKE  ONE TABLET BY MOUTH ONCE DAILY  . diphenhydrAMINE (BENADRYL) 25 MG tablet Take 1 tab as needed for allergic reactions  . ipratropium (ATROVENT) 0.03 % nasal spray Place 2 sprays into both nostrils 2 (two) times daily. (Patient taking differently: Place 2 sprays 2 (two) times daily as needed into both nostrils. )  . metoprolol succinate (TOPROL-XL) 100 MG 24 hr tablet TAKE ONE TABLET BY MOUTH ONCE DAILY  . mometasone (NASONEX) 50 MCG/ACT nasal spray USE TWO SPRAY(S) IN EACH NOSTRIL ONCE DAILY  . rosuvastatin (CRESTOR) 20 MG tablet TAKE ONE TABLET BY MOUTH ONCE DAILY  . [DISCONTINUED] mupirocin ointment (BACTROBAN) 2 % Apply to nose using cotton swab twice daily x 5 days     Allergies:   Aspirin and Nsaids   Social History   Socioeconomic History  . Marital status: Married    Spouse name: None  . Number of children: 2  . Years of education: None  . Highest education level: None  Social Needs  . Financial resource strain: None  . Food insecurity - worry: None  . Food insecurity - inability: None  . Transportation needs - medical: None  . Transportation needs - non-medical: None  Occupational History  . Occupation: Carpenter  Tobacco Use  . Smoking status: Former Smoker    Types: Cigars  . Smokeless tobacco: Never Used  Substance and Sexual Activity  .  Alcohol use: Yes    Alcohol/week: 21.0 oz    Types: 42 Standard drinks or equivalent per week    Comment: Drinks 4 per day  . Drug use: No  . Sexual activity: None  Other Topics Concern  . None  Social History Narrative  . None     Family History:  Family History  Problem Relation Age of Onset  . Heart attack Father   . Colon cancer Neg Hx   . Colon polyps Neg Hx   . Esophageal cancer Neg Hx   . Kidney disease Neg Hx     ROS:   Please see the history of present illness. All other systems are reviewed and otherwise negative.    PHYSICAL EXAM:   VS:  BP 136/82   Pulse 78   Resp 16   Ht 5\' 7"  (1.702 m)   Wt 280  lb (127 kg)   SpO2 98%   BMI 43.85 kg/m   BMI: Body mass index is 43.85 kg/m. GEN: Well nourished, well developed obese (central obesity) WM, in no acute distress  HEENT: normocephalic, atraumatic Neck: no JVD, carotid bruits, or masses Cardiac: RRR; no murmurs, rubs, or gallops, no edema  Respiratory:  clear to auscultation bilaterally, normal work of breathing GI: soft, nontender, nondistended, + BS MS: no deformity or atrophy  Skin: warm and dry, no rash Neuro:  Alert and Oriented x 3, Strength and sensation are intact, follows commands Psych: euthymic mood, full affect  Wt Readings from Last 3 Encounters:  05/05/17 280 lb (127 kg)  08/23/16 270 lb (122.5 kg)  03/14/16 271 lb 6.4 oz (123.1 kg)      Studies/Labs Reviewed:   EKG:  EKG was ordered today and personally reviewed by me and demonstrates NSR 71bpm prior Inferior infarct, no acute changes, mild PR prolongation 233ms   Recent Labs: 06/09/2016: ALT 19   Lipid Panel    Component Value Date/Time   CHOL 153 06/09/2016 0859   TRIG 76 06/09/2016 0859   HDL 55 06/09/2016 0859   CHOLHDL 2.8 06/09/2016 0859   VLDL 15 06/09/2016 0859   LDLCALC 83 06/09/2016 0859   LDLDIRECT 152.7 10/24/2009 0000    Additional studies/ records that were reviewed today include: Summarized above.    ASSESSMENT & PLAN:   1. CAD - doing well overall. Continue Plavix, beta blocker, statin. He is allergic to aspirin. Will update labs today - CMET, CBC, lipid panel.  2. Cardiomyopathy - no prior hx of clinical CHF. F/u echocardiogram. If EF remains decreased, would need to consider adding ACEI/ARB. He is on amlodipine but given that he has residual CAD, this may be appropriate to continue for anti-anginal effect. 3. HTN - currently at upper limits of normal. Discussed goal of long-term weight loss and monitoring BP. Asked him to follow at home and call if tending to run >130/80. F/u echo to determine if we should consider  ACEi/ARB. 4. Hyperlipidemia - as above, f/u lipids/liver. He had prior muscle aches on Crestor but seems to be tolerating current dose well. Could consider re-challenge of increased dose if LDL remains <70. 5. Dilated ascending aorta - due for f/u. Will reassess by echocardiogram to start.  Disposition: F/u with Dr. Angelena Form in 1 year.   Medication Adjustments/Labs and Tests Ordered: Current medicines are reviewed at length with the patient today.  Concerns regarding medicines are outlined above. Medication changes, Labs and Tests ordered today are summarized above and listed in the Patient Instructions accessible  in Encounters.   Signed, Charlie Pitter, PA-C  05/05/2017 9:58 AM    [address], [place], [place]  [address] Phone: [phone]; Fax: [phone]

## 2017-05-05 ENCOUNTER — Encounter: Payer: Self-pay | Admitting: Physician Assistant

## 2017-05-05 ENCOUNTER — Encounter (INDEPENDENT_AMBULATORY_CARE_PROVIDER_SITE_OTHER): Payer: Self-pay

## 2017-05-05 ENCOUNTER — Ambulatory Visit (INDEPENDENT_AMBULATORY_CARE_PROVIDER_SITE_OTHER): Payer: BLUE CROSS/BLUE SHIELD | Admitting: Physician Assistant

## 2017-05-05 VITALS — BP 136/82 | HR 78 | Resp 16 | Ht 67.0 in | Wt 280.0 lb

## 2017-05-05 DIAGNOSIS — E785 Hyperlipidemia, unspecified: Secondary | ICD-10-CM | POA: Diagnosis not present

## 2017-05-05 DIAGNOSIS — I10 Essential (primary) hypertension: Secondary | ICD-10-CM | POA: Diagnosis not present

## 2017-05-05 DIAGNOSIS — I429 Cardiomyopathy, unspecified: Secondary | ICD-10-CM | POA: Diagnosis not present

## 2017-05-05 DIAGNOSIS — I2510 Atherosclerotic heart disease of native coronary artery without angina pectoris: Secondary | ICD-10-CM | POA: Diagnosis not present

## 2017-05-05 DIAGNOSIS — I77810 Thoracic aortic ectasia: Secondary | ICD-10-CM

## 2017-05-05 LAB — COMPREHENSIVE METABOLIC PANEL
A/G RATIO: 1.8 (ref 1.2–2.2)
ALT: 18 IU/L (ref 0–44)
AST: 21 IU/L (ref 0–40)
Albumin: 4.5 g/dL (ref 3.5–5.5)
Alkaline Phosphatase: 72 IU/L (ref 39–117)
BILIRUBIN TOTAL: 0.4 mg/dL (ref 0.0–1.2)
BUN/Creatinine Ratio: 17 (ref 9–20)
BUN: 16 mg/dL (ref 6–24)
CHLORIDE: 98 mmol/L (ref 96–106)
CO2: 23 mmol/L (ref 20–29)
Calcium: 9.3 mg/dL (ref 8.7–10.2)
Creatinine, Ser: 0.92 mg/dL (ref 0.76–1.27)
GFR calc non Af Amer: 93 mL/min/{1.73_m2} (ref >59)
GFR, EST AFRICAN AMERICAN: 107 mL/min/{1.73_m2} (ref >59)
GLOBULIN, TOTAL: 2.5 g/dL (ref 1.5–4.5)
Glucose: 109 mg/dL — ABNORMAL HIGH (ref 65–99)
POTASSIUM: 4.6 mmol/L (ref 3.5–5.2)
SODIUM: 136 mmol/L (ref 134–144)
TOTAL PROTEIN: 7 g/dL (ref 6.0–8.5)

## 2017-05-05 LAB — CBC
HEMOGLOBIN: 13.8 g/dL (ref 13.0–17.7)
Hematocrit: 40.1 % (ref 37.5–51.0)
MCH: 31.5 pg (ref 26.6–33.0)
MCHC: 34.4 g/dL (ref 31.5–35.7)
MCV: 92 fL (ref 79–97)
Platelets: 317 10*3/uL (ref 150–379)
RBC: 4.38 x10E6/uL (ref 4.14–5.80)
RDW: 13.1 % (ref 12.3–15.4)
WBC: 7.3 10*3/uL (ref 3.4–10.8)

## 2017-05-05 LAB — LIPID PANEL
CHOLESTEROL TOTAL: 173 mg/dL (ref 100–199)
Chol/HDL Ratio: 2.8 ratio (ref 0.0–5.0)
HDL: 61 mg/dL (ref >39)
LDL CALC: 97 mg/dL (ref 0–99)
Triglycerides: 73 mg/dL (ref 0–149)
VLDL CHOLESTEROL CAL: 15 mg/dL (ref 5–40)

## 2017-05-05 NOTE — Patient Instructions (Signed)
Medication Instructions:  Your physician recommends that you continue on your current medications as directed. Please refer to the Current Medication list given to you today.  Labwork: Today for Complete metabolic panel, CBC and fasting lipids  Testing/Procedures: Your physician has requested that you have an echocardiogram. Echocardiography is a painless test that uses sound waves to create images of your heart. It provides your doctor with information about the size and shape of your heart and how well your heart's chambers and valves are working. This procedure takes approximately one hour. There are no restrictions for this procedure.   Follow-Up: Your physician wants you to follow-up in: 1 year with Dr. Angelena Form. You will receive a reminder letter in the mail two months in advance. If you don't receive a letter, please call our office to schedule the follow-up appointment.   Any Other Special Instructions Will Be Listed Below (If Applicable).     If you need a refill on your cardiac medications before your next appointment, please call your pharmacy.

## 2017-05-12 ENCOUNTER — Ambulatory Visit (HOSPITAL_COMMUNITY): Payer: BLUE CROSS/BLUE SHIELD | Attending: Cardiology

## 2017-05-12 ENCOUNTER — Other Ambulatory Visit: Payer: Self-pay

## 2017-05-12 ENCOUNTER — Telehealth: Payer: Self-pay | Admitting: Physician Assistant

## 2017-05-12 DIAGNOSIS — E785 Hyperlipidemia, unspecified: Secondary | ICD-10-CM | POA: Insufficient documentation

## 2017-05-12 DIAGNOSIS — E669 Obesity, unspecified: Secondary | ICD-10-CM | POA: Insufficient documentation

## 2017-05-12 DIAGNOSIS — Z6841 Body Mass Index (BMI) 40.0 and over, adult: Secondary | ICD-10-CM | POA: Diagnosis not present

## 2017-05-12 DIAGNOSIS — I10 Essential (primary) hypertension: Secondary | ICD-10-CM | POA: Insufficient documentation

## 2017-05-12 DIAGNOSIS — E7800 Pure hypercholesterolemia, unspecified: Secondary | ICD-10-CM

## 2017-05-12 DIAGNOSIS — I252 Old myocardial infarction: Secondary | ICD-10-CM | POA: Diagnosis not present

## 2017-05-12 DIAGNOSIS — I77810 Thoracic aortic ectasia: Secondary | ICD-10-CM | POA: Insufficient documentation

## 2017-05-12 DIAGNOSIS — I2510 Atherosclerotic heart disease of native coronary artery without angina pectoris: Secondary | ICD-10-CM

## 2017-05-12 DIAGNOSIS — Z79899 Other long term (current) drug therapy: Secondary | ICD-10-CM

## 2017-05-12 MED ORDER — ROSUVASTATIN CALCIUM 40 MG PO TABS: 40.0000 mg | ORAL_TABLET | Freq: Every day | ORAL | 3 refills | Status: DC

## 2017-05-12 NOTE — Telephone Encounter (Signed)
F/u message ° °Pt returning RN call .please call back to discuss  °

## 2017-05-12 NOTE — Telephone Encounter (Signed)
Follow up     Returning Luis Mitchell call

## 2017-05-12 NOTE — Telephone Encounter (Signed)
-----   Message from Charlie Pitter, [place] sent at 05/05/2017  5:24 PM EST ----- Please let patient know labs were except a few findings: - f/u PCP for mildly elevated blood sugar - could represent pre-diabetes - LDL is not at goal. Needs to be less than 70. His is 97. I read that he's had myalgias on Crestor before but seemed to be tolerating 20mg  daily without difficulty. If he's amenable to revisiting, can try a re-challenge trial of Crestor 40mg  daily. Alternative choice would be going to Lipitor 80mg  (equivalent to Crestor 40) if he has not tried that before. If he does not wish to try this due to prior experience with higher doses, can add Zetia 10mg  daily and f/u CMET/lipids in 6 weeks.  Lisbeth Renshaw Dunn PA-C

## 2017-05-15 ENCOUNTER — Telehealth: Payer: Self-pay | Admitting: Physician Assistant

## 2017-05-15 MED ORDER — LOSARTAN POTASSIUM 25 MG PO TABS: 25.0000 mg | ORAL_TABLET | Freq: Every day | ORAL | 3 refills | Status: DC

## 2017-05-15 NOTE — Telephone Encounter (Signed)
-----   Message from Charlie Pitter, [place] sent at 05/12/2017  2:11 PM EST ----- Please let patient know echo showed weakness of the heart muscle. I suspect this is stable compared to previous as his EF in 2016 was 37% by nuc and 45-50% by echo. It also showed abnormal relaxation of the heart called diastolic dysfunction.   Although he has not had issues before with congestive heart failure, these findings can put him at risk for developing symptoms of this like weight gain the form of swelling (legs, abdomen), shortness of breath while at rest, walking or lying down, or chest tightness so he needs to watch out for these symptoms. Would also advise to generally stick to lower sodium diet (aiming for maximim 2,000mg  per day) and taking in no more than 2L of total fluid per day. Aortic root is mildly dilated; this is something that will need to be followed yearly with echo at discretion of primary cardiologist.  Knowing that his EF is still low, would recommend he start losartan 25mg  daily and f/u in HTN clinic in approx 7-10 days with BMET to titrate to a goal blood pressure of 120/80. This will reduce progression of CHF in the future. Although amlodipine is less ideal with low EF, he has known residual coronary disease so I would continue at this time.  Dayna Dunn PA-C

## 2017-05-15 NOTE — Telephone Encounter (Signed)
Spoke with pt and his wife, per pt request, and they have both been made aware of his echo results and recommendations. They verbalized understanding.

## 2017-05-15 NOTE — Telephone Encounter (Signed)
Follow Up:; ° ° °Returning your call. °

## 2017-05-15 NOTE — Telephone Encounter (Signed)
Returned pts call to clear up any questions he may have re: his Echocardiogram. Pt thanked me for my call.

## 2017-05-15 NOTE — Telephone Encounter (Signed)
F/u message  Pt call requesting to get a call back for echo results. Pt ask for results to be left on Vm in detail. Please call back

## 2017-05-25 NOTE — Progress Notes (Signed)
Patient ID: Luis Mitchell                 DOB: Feb 27, 1961                      MRN: [id number]     HPI: Luis Mitchell is a 56 y.o. male of Dr. Angelena Mitchell referred by Luis Copa, PA to HTN clinic. PMH is significant for CAD (MI s/p BMS to [facility] 2007 with residual 90% diagonal, 50% Cx disease, nonischemic 08/2014), chronic cardiomyopathy with EF 45-50% in 08/2014, mildly dilated ascending aorta, HTN, and HLD. Losartan 25mg  daily was started on 05/12/17.  Patient arrives to clinic in good spirits. Reports no issues since starting losartan. Denies headache, dizziness, lightheadedness, or falls. Reports that they have him on a reduced fluid diet because of his heart.  Current HTN meds: amlodipine 10mg  daily, losartan 25mg  daily, metoprolol succinate 100mg  daily Previously tried: HCTZ 25mg  daily BP goal: < 130/80 mmHg   Family History: MI in father.  Social History: Former cigar smoker, quit when he had his MI ~10 years ago. Drinks 2 beer beverages per day. Denies illicit drug use.  Diet:  Skips breakfast, sometimes will have a pack of nabs for lunch, cooks dinner at home.  Doesn't add salt to food and has cut back on salt, uses dried onions, garlic, or salt free seasonings. Doesn't eat out due to allergies to preservatives, etc. Only eats fresh or frozen vegetables. 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  Home BP readings: Hasn't been checking his blood pressure lately.  Wt Readings from Last 3 Encounters:  05/05/17 280 lb (127 kg)  08/23/16 270 lb (122.5 kg)  03/14/16 271 lb 6.4 oz (123.1 kg)   BP Readings from Last 3 Encounters:  05/05/17 136/82  08/23/16 132/84  03/14/16 (!) 150/90   Pulse Readings from Last 3 Encounters:  05/05/17 78  08/23/16 97  03/14/16 86    Renal function: CrCl cannot be calculated (Unknown ideal weight.).  Past Medical History:    Diagnosis Date  . Cardiomyopathy ([place])    a. EF 45-50% by echo 08/2014.  Marland Kitchen Coronary atherosclerosis of native coronary artery    a. MI s/p BMS to [facility] 2007 with residual 90% diagonal, 50% Cx disease. b. stress test 08/2014 without ischemia.  Marland Kitchen HTN (hypertension)   . Hyperlipidemia   . Mild dilation of ascending aorta (HCC)   . Myocardial infarction ([place]) 06/20/2006    Current Outpatient Medications on File Prior to Visit  Medication Sig Dispense Refill  . amLODipine (NORVASC) 10 MG tablet TAKE ONE TABLET BY MOUTH ONCE DAILY 90 tablet 1  . clopidogrel (PLAVIX) 75 MG tablet TAKE ONE TABLET BY MOUTH ONCE DAILY 90 tablet 1  . diphenhydrAMINE (BENADRYL) 25 MG tablet Take 1 tab as needed for allergic reactions    . ipratropium (ATROVENT) 0.03 % nasal spray Place 2 sprays into both nostrils 2 (two) times daily. (Patient taking differently: Place 2 sprays 2 (two) times daily as needed into both nostrils. ) 30 mL 0  . losartan (COZAAR) 25 MG tablet Take 1 tablet (25 mg total) daily by mouth. 90 tablet 3  . metoprolol succinate (TOPROL-XL) 100 MG 24 hr tablet TAKE ONE TABLET BY MOUTH ONCE DAILY 90 tablet 1  . mometasone (NASONEX) 50 MCG/ACT nasal spray USE TWO SPRAY(S) IN EACH NOSTRIL ONCE DAILY 17 g  2  . rosuvastatin (CRESTOR) 40 MG tablet Take 1 tablet (40 mg total) daily by mouth. 90 tablet 3   No current facility-administered medications on file prior to visit.     Allergies  Allergen Reactions  . Aspirin Swelling  . Nsaids Swelling     Assessment/Plan:  1. Hypertension - Patient's BP is 142/98, which is above his goal of < 130/80 mmHg. Continue amlodipine, losartan, and metoprolol. Encouraged patient to check his blood pressure at home and bring in a log at his next visit. Will check BMET today and call with results as well as instructions on increasing losartan to 50mg  daily. Follow up in 3 weeks.  Patient seen with Luis Mitchell, PharmD Candidate  Addendum: BMET Cr 0.87, K 4.6.  Called pt to inform him of lab results and to increase losartan to 50mg  daily. Pt states that he uses Product/process development scientist on [address].

## 2017-05-26 ENCOUNTER — Other Ambulatory Visit: Payer: BLUE CROSS/BLUE SHIELD

## 2017-05-26 ENCOUNTER — Ambulatory Visit (INDEPENDENT_AMBULATORY_CARE_PROVIDER_SITE_OTHER): Payer: BLUE CROSS/BLUE SHIELD | Admitting: Pharmacist

## 2017-05-26 VITALS — BP 142/98 | HR 75

## 2017-05-26 DIAGNOSIS — Z79899 Other long term (current) drug therapy: Secondary | ICD-10-CM

## 2017-05-26 DIAGNOSIS — I10 Essential (primary) hypertension: Secondary | ICD-10-CM

## 2017-05-26 LAB — BASIC METABOLIC PANEL
BUN/Creatinine Ratio: 20 (ref 9–20)
BUN: 17 mg/dL (ref 6–24)
CALCIUM: 9.4 mg/dL (ref 8.7–10.2)
CHLORIDE: 101 mmol/L (ref 96–106)
CO2: 24 mmol/L (ref 20–29)
Creatinine, Ser: 0.87 mg/dL (ref 0.76–1.27)
GFR calc Af Amer: 112 mL/min/{1.73_m2} (ref >59)
GFR, EST NON AFRICAN AMERICAN: 96 mL/min/{1.73_m2} (ref >59)
GLUCOSE: 119 mg/dL — AB (ref 65–99)
POTASSIUM: 4.6 mmol/L (ref 3.5–5.2)
SODIUM: 137 mmol/L (ref 134–144)

## 2017-05-26 NOTE — Patient Instructions (Addendum)
It was great to see you today!  1. We will call you tomorrow with your lab results and to instruct you on increasing your losartan to 50mg  daily. If we decide to increase your dose, you can take two 25mg  tablets daily until you run out and then pick up your new prescription from the pharmacy.  2. Continue taking all of your other medications as you have been.  3. Follow up for in clinic in 3 weeks for labs and to check your blood pressure. The labs are fasting so do not eat anything after midnight before coming into clinic.  4. Call [phone] with any issues

## 2017-05-27 MED ORDER — LOSARTAN POTASSIUM 50 MG PO TABS: 50.0000 mg | ORAL_TABLET | Freq: Every day | ORAL | 11 refills | Status: DC

## 2017-06-18 ENCOUNTER — Ambulatory Visit: Payer: BLUE CROSS/BLUE SHIELD

## 2017-06-18 NOTE — Progress Notes (Signed)
Patient ID: Luis Mitchell Mitchell                 DOB: 11/10/1960                      MRN: [id number]     HPI: Luis Mitchell Mitchell is a 56 y.o. male of Luis Mitchell Mitchell referred by Luis Mitchell Copa, PA to HTN clinic. PMH is significant for CAD (MI s/p BMS to [facility] 2007 with residual 90% diagonal, 50% Cx disease, nonischemic 08/2014), chronic cardiomyopathy with EF 45-50% in 08/2014, mildly dilated ascending aorta, HTN, and HLD. At last visit 3 weeks ago, BP was elevated at 142/98 and losartan was increased to 50mg  daily. Pt presents today for follow up.  Pt reports feeling well overall. He denies dizziness, blurred vision, falls, or headache. He recently found his BP cuff but has only checked his readings a few times. Systolic readings usually range upper 130s-140s. Pt reports losing 9 lbs recently - he has been watching his portions. He takes his BP medications at night and has not had any coffee this morning.  Current HTN meds: amlodipine 10mg  daily, losartan 50mg  daily, metoprolol succinate 100mg  daily Previously tried: HCTZ 25mg  daily BP goal: < 130/80 mmHg   Family History: MI in father.  Social History: Former cigar smoker, quit when he had his MI ~10 years ago. Drinks 2 beer beverages per day. Denies illicit drug use.  Diet:  Skips breakfast, sometimes will have a pack of nabs for lunch, cooks dinner at home.  Doesn't add salt to food and has cut back on salt, uses dried onions, garlic, or salt free seasonings. Doesn't eat out due to allergies to preservatives, etc. Only eats fresh or frozen vegetables. 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  Home BP readings: A few readings of 130-140s/80s  Wt Readings from Last 3 Encounters:  05/05/17 280 lb (127 kg)  08/23/16 270 lb (122.5 kg)  03/14/16 271 lb 6.4 oz (123.1 kg)   BP Readings from Last 3 Encounters:  05/26/17 (!) 142/98    05/05/17 136/82  08/23/16 132/84   Pulse Readings from Last 3 Encounters:  05/26/17 75  05/05/17 78  08/23/16 97    Renal function: CrCl cannot be calculated (Patient's most recent lab result is older than the maximum 21 days allowed.).  Past Medical History:  Diagnosis Date  . Cardiomyopathy ([place])    a. EF 45-50% by echo 08/2014.  Marland Kitchen Coronary atherosclerosis of native coronary artery    a. MI s/p BMS to [facility] 2007 with residual 90% diagonal, 50% Cx disease. b. stress test 08/2014 without ischemia.  Marland Kitchen HTN (hypertension)   . Hyperlipidemia   . Mild dilation of ascending aorta (HCC)   . Myocardial infarction ([place]) 06/20/2006    Current Outpatient Medications on File Prior to Visit  Medication Sig Dispense Refill  . amLODipine (NORVASC) 10 MG tablet TAKE ONE TABLET BY MOUTH ONCE DAILY 90 tablet 1  . clopidogrel (PLAVIX) 75 MG tablet TAKE ONE TABLET BY MOUTH ONCE DAILY 90 tablet 1  . diphenhydrAMINE (BENADRYL) 25 MG tablet Take 1 tab as needed for allergic reactions    . ipratropium (ATROVENT) 0.03 % nasal spray Place 2 sprays into both nostrils 2 (two) times daily. (Patient taking differently: Place 2 sprays 2 (two) times daily as needed into both nostrils. ) 30 mL 0  .  losartan (COZAAR) 50 MG tablet Take 1 tablet (50 mg total) by mouth daily. Dose increase 30 tablet 11  . metoprolol succinate (TOPROL-XL) 100 MG 24 hr tablet TAKE ONE TABLET BY MOUTH ONCE DAILY 90 tablet 1  . mometasone (NASONEX) 50 MCG/ACT nasal spray USE TWO SPRAY(S) IN EACH NOSTRIL ONCE DAILY 17 g 2  . rosuvastatin (CRESTOR) 40 MG tablet Take 1 tablet (40 mg total) daily by mouth. 90 tablet 3   No current facility-administered medications on file prior to visit.     Allergies  Allergen Reactions  . Aspirin Swelling  . Nsaids Swelling     Assessment/Plan:  1. Hypertension - BP remains above goal <130/20mmHg. Will continue to optimize losartan dose and increase to 100mg  daily pending BMET results from today.  Continue amlodipine 10mg  daily and metoprolol succinate 100mg  daily. F/u in HTN clinic in 3 weeks for BP check and BMET.  Luis Mitchell Mitchell, PharmD, CPP, [place] [address], [place], [place] [address] Phone: [phone]; Fax: [phone] 06/19/2017 9:09 AM  Addendum: BMET shows stable SCr and K - will continue with plan to increase losartan to 100mg  daily. LMOM for pt.

## 2017-06-19 ENCOUNTER — Ambulatory Visit (INDEPENDENT_AMBULATORY_CARE_PROVIDER_SITE_OTHER): Payer: BLUE CROSS/BLUE SHIELD | Admitting: Pharmacist

## 2017-06-19 ENCOUNTER — Other Ambulatory Visit: Payer: BLUE CROSS/BLUE SHIELD | Admitting: *Deleted

## 2017-06-19 VITALS — BP 142/86 | HR 71

## 2017-06-19 DIAGNOSIS — I10 Essential (primary) hypertension: Secondary | ICD-10-CM | POA: Diagnosis not present

## 2017-06-19 DIAGNOSIS — I252 Old myocardial infarction: Secondary | ICD-10-CM

## 2017-06-19 DIAGNOSIS — E7800 Pure hypercholesterolemia, unspecified: Secondary | ICD-10-CM

## 2017-06-19 LAB — COMPREHENSIVE METABOLIC PANEL
A/G RATIO: 2 (ref 1.2–2.2)
ALK PHOS: 75 IU/L (ref 39–117)
ALT: 24 IU/L (ref 0–44)
AST: 19 IU/L (ref 0–40)
Albumin: 4.3 g/dL (ref 3.5–5.5)
BUN/Creatinine Ratio: 24 — ABNORMAL HIGH (ref 9–20)
BUN: 19 mg/dL (ref 6–24)
Bilirubin Total: 0.3 mg/dL (ref 0.0–1.2)
CO2: 24 mmol/L (ref 20–29)
Calcium: 9.2 mg/dL (ref 8.7–10.2)
Chloride: 99 mmol/L (ref 96–106)
Creatinine, Ser: 0.78 mg/dL (ref 0.76–1.27)
GFR calc Af Amer: 116 mL/min/{1.73_m2} (ref >59)
GFR, EST NON AFRICAN AMERICAN: 100 mL/min/{1.73_m2} (ref >59)
GLOBULIN, TOTAL: 2.1 g/dL (ref 1.5–4.5)
Glucose: 111 mg/dL — ABNORMAL HIGH (ref 65–99)
POTASSIUM: 4.5 mmol/L (ref 3.5–5.2)
SODIUM: 138 mmol/L (ref 134–144)
Total Protein: 6.4 g/dL (ref 6.0–8.5)

## 2017-06-19 LAB — LIPID PANEL
CHOL/HDL RATIO: 3.1 ratio (ref 0.0–5.0)
CHOLESTEROL TOTAL: 143 mg/dL (ref 100–199)
HDL: 46 mg/dL (ref >39)
LDL Calculated: 72 mg/dL (ref 0–99)
TRIGLYCERIDES: 124 mg/dL (ref 0–149)
VLDL Cholesterol Cal: 25 mg/dL (ref 5–40)

## 2017-06-19 MED ORDER — LOSARTAN POTASSIUM 100 MG PO TABS: 100.0000 mg | ORAL_TABLET | Freq: Every day | ORAL | 11 refills | Status: DC

## 2017-06-19 NOTE — Patient Instructions (Addendum)
It was nice to see you today.  Please increase your losartan to 100mg  daily.  Continue taking your other medications.  Check your blood pressure at home and record your readings.  Follow up in clinic in 3 weeks. Bring your blood pressure cuff to your follow up visit.

## 2017-06-22 ENCOUNTER — Telehealth: Payer: Self-pay | Admitting: Cardiovascular Disease

## 2017-06-22 NOTE — Telephone Encounter (Signed)
Spoke with pt, aware of lab results. 

## 2017-06-22 NOTE — Telephone Encounter (Signed)
New message  ° ° °Pt verbalized that he is returning call for rn °

## 2017-06-22 NOTE — Telephone Encounter (Signed)
Left message for pt to call.

## 2017-06-29 ENCOUNTER — Other Ambulatory Visit: Payer: Self-pay | Admitting: Physician Assistant

## 2017-06-29 ENCOUNTER — Other Ambulatory Visit: Payer: Self-pay | Admitting: Cardiovascular Disease

## 2017-06-29 ENCOUNTER — Other Ambulatory Visit: Payer: BLUE CROSS/BLUE SHIELD

## 2017-06-29 DIAGNOSIS — J329 Chronic sinusitis, unspecified: Secondary | ICD-10-CM

## 2017-07-09 ENCOUNTER — Ambulatory Visit: Payer: BLUE CROSS/BLUE SHIELD | Admitting: Cardiovascular Disease

## 2017-07-10 ENCOUNTER — Ambulatory Visit (INDEPENDENT_AMBULATORY_CARE_PROVIDER_SITE_OTHER): Payer: BLUE CROSS/BLUE SHIELD | Admitting: Pharmacist

## 2017-07-10 VITALS — BP 132/84 | HR 71

## 2017-07-10 DIAGNOSIS — I10 Essential (primary) hypertension: Secondary | ICD-10-CM

## 2017-07-10 LAB — BASIC METABOLIC PANEL
BUN / CREAT RATIO: 22 — AB (ref 9–20)
BUN: 20 mg/dL (ref 6–24)
CHLORIDE: 98 mmol/L (ref 96–106)
CO2: 25 mmol/L (ref 20–29)
Calcium: 9.5 mg/dL (ref 8.7–10.2)
Creatinine, Ser: 0.93 mg/dL (ref 0.76–1.27)
GFR calc Af Amer: 105 mL/min/{1.73_m2} (ref >59)
GFR calc non Af Amer: 91 mL/min/{1.73_m2} (ref >59)
GLUCOSE: 124 mg/dL — AB (ref 65–99)
POTASSIUM: 4.8 mmol/L (ref 3.5–5.2)
SODIUM: 136 mmol/L (ref 134–144)

## 2017-07-10 NOTE — Patient Instructions (Addendum)
It was nice to see you today  Your blood pressure was improved and very close to your goal 130/80  Continue taking your amlodipine, losartan, and metoprolol like you have been  Call clinic if you notice that your blood pressure is staying consistently elevated > 150/90

## 2017-07-10 NOTE — Progress Notes (Signed)
Patient ID: AIRON SAHNI                 DOB: 11-21-1960                      MRN: [id number]     HPI: Luis Mitchell a 57 y.o.maleof Dr. Tressie Stalker byDayna Idolina Primer, PAto HTN clinic.PMH is significant for CAD (MI s/p BMS to [facility] 2007 with residual 90% diagonal, 50% Cx disease, nonischemic 08/2014), chronic cardiomyopathy with EF 45-50% in 08/2014, mildly dilated ascending aorta, HTN, and HLD. At last visit 3 weeks ago, BP was elevated at 142/98 and losartan was increased to 100mg  daily. Pt presents today for follow up.  Pt presents today in good spirits. He has been tolerating the higher dose of losartan well and denies dizziness, blurred vision, or headaches. He takes all of his medications at night and has not had any caffeine yet today. He has been fighting off a cold and has been using a nasal decongestant and Benadryl. He has checked his BP at home occasionally - most readings are in the low 097D systolic. Low systolic reading of 532 and high of 140s.  Current HTN meds:amlodipine 10mg  daily, losartan 100mg  daily, metoprolol succinate 100mg  daily Previously tried:HCTZ 25mg  daily BP goal:< 130/80 mmHg  Family History:MI in father.  Social History:Former cigar smoker, quit when he had his MI ~10 years ago. Drinks2 beerbeverages per day. Denies illicit drug use.  Diet: Skips breakfast, sometimes will have a pack of nabs for lunch, cooks dinner at home.  Doesn'tadd salt to food and has cut back on salt, uses dried onions, garlic, or salt free seasonings. Doesn't eat out due to allergies to preservatives, etc. Only eats fresh or frozen vegetables. 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  Home BP readings:A few readings of 130-140s/80s   Wt Readings from Last 3 Encounters:  05/05/17 280 lb (127 kg)  08/23/16 270 lb (122.5 kg)  03/14/16 271 lb 6.4  oz (123.1 kg)   BP Readings from Last 3 Encounters:  06/19/17 (!) 142/86  05/26/17 (!) 142/98  05/05/17 136/82   Pulse Readings from Last 3 Encounters:  06/19/17 71  05/26/17 75  05/05/17 78    Renal function: CrCl cannot be calculated (Unknown ideal weight.).  Past Medical History:  Diagnosis Date  . Cardiomyopathy ([place])    a. EF 45-50% by echo 08/2014.  Marland Kitchen Coronary atherosclerosis of native coronary artery    a. MI s/p BMS to [facility] 2007 with residual 90% diagonal, 50% Cx disease. b. stress test 08/2014 without ischemia.  Marland Kitchen HTN (hypertension)   . Hyperlipidemia   . Mild dilation of ascending aorta (HCC)   . Myocardial infarction ([place]) 06/20/2006    Current Outpatient Medications on File Prior to Visit  Medication Sig Dispense Refill  . amLODipine (NORVASC) 10 MG tablet TAKE ONE TABLET BY MOUTH ONCE DAILY 90 tablet 3  . clopidogrel (PLAVIX) 75 MG tablet TAKE ONE TABLET BY MOUTH ONCE DAILY 90 tablet 3  . diphenhydrAMINE (BENADRYL) 25 MG tablet Take 1 tab as needed for allergic reactions    . ipratropium (ATROVENT) 0.03 % nasal spray Place 2 sprays into both nostrils 2 (two) times daily. (Patient taking differently: Place 2 sprays 2 (two) times daily as needed into both nostrils. ) 30 mL 0  . losartan (COZAAR) 100 MG tablet Take 1 tablet (100 mg  total) by mouth daily. Dose increase 30 tablet 11  . metoprolol succinate (TOPROL-XL) 100 MG 24 hr tablet TAKE ONE TABLET BY MOUTH ONCE DAILY 90 tablet 3  . mometasone (NASONEX) 50 MCG/ACT nasal spray USE TWO SPRAY(S) IN EACH NOSTRIL ONCE DAILY 17 g 2  . rosuvastatin (CRESTOR) 40 MG tablet Take 1 tablet (40 mg total) daily by mouth. 90 tablet 3   No current facility-administered medications on file prior to visit.     Allergies  Allergen Reactions  . Aspirin Swelling  . Nsaids Swelling     Assessment/Plan:  1. Hypertension - BP very close to goal <130/67mmHg. Will continue losartan 100mg  daily, amlodipine 10mg  daily, and Toprol  100mg  daily. Will check BMET today with recent dose increase of losartan. F/u in HTN clinic as needed.   Megan E. Supple, PharmD, CPP, [place] [address] [place], [place] [address] Phone: [phone]; Fax: [phone] 07/10/2017 9:15 AM

## 2017-08-07 ENCOUNTER — Ambulatory Visit (INDEPENDENT_AMBULATORY_CARE_PROVIDER_SITE_OTHER): Payer: BLUE CROSS/BLUE SHIELD | Admitting: Urgent Care

## 2017-08-07 ENCOUNTER — Encounter: Payer: Self-pay | Admitting: Urgent Care

## 2017-08-07 VITALS — BP 130/80 | HR 73 | Temp 98.1°F | Resp 18 | Ht 67.5 in | Wt 279.0 lb

## 2017-08-07 DIAGNOSIS — J3089 Other allergic rhinitis: Secondary | ICD-10-CM | POA: Diagnosis not present

## 2017-08-07 DIAGNOSIS — I10 Essential (primary) hypertension: Secondary | ICD-10-CM

## 2017-08-07 DIAGNOSIS — J018 Other acute sinusitis: Secondary | ICD-10-CM

## 2017-08-07 MED ORDER — AMOXICILLIN 500 MG PO CAPS: 500.0000 mg | ORAL_CAPSULE | Freq: Three times a day (TID) | ORAL | 0 refills | Status: DC

## 2017-08-07 MED ORDER — PSEUDOEPHEDRINE HCL 30 MG PO TABS: 30.0000 mg | ORAL_TABLET | Freq: Three times a day (TID) | ORAL | 0 refills | Status: DC | PRN

## 2017-08-07 MED ORDER — CETIRIZINE HCL 10 MG PO TABS: 10.0000 mg | ORAL_TABLET | Freq: Every day | ORAL | 11 refills | Status: AC

## 2017-08-07 NOTE — Patient Instructions (Addendum)
Sinusitis, Adult Sinusitis is soreness and inflammation of your sinuses. Sinuses are hollow spaces in the bones around your face. Your sinuses are located:  Around your eyes.  In the middle of your forehead.  Behind your nose.  In your cheekbones.  Your sinuses and nasal passages are lined with a stringy fluid (mucus). Mucus normally drains out of your sinuses. When your nasal tissues become inflamed or swollen, the mucus can become trapped or blocked so air cannot flow through your sinuses. This allows bacteria, viruses, and funguses to grow, which leads to infection. Sinusitis can develop quickly and last for 7?10 days (acute) or for more than 12 weeks (chronic). Sinusitis often develops after a cold. What are the causes? This condition is caused by anything that creates swelling in the sinuses or stops mucus from draining, including:  Allergies.  Asthma.  Bacterial or viral infection.  Abnormally shaped bones between the nasal passages.  Nasal growths that contain mucus (nasal polyps).  Narrow sinus openings.  Pollutants, such as chemicals or irritants in the air.  A foreign object stuck in the nose.  A fungal infection. This is rare.  What increases the risk? The following factors may make you more likely to develop this condition:  Having allergies or asthma.  Having had a recent cold or respiratory tract infection.  Having structural deformities or blockages in your nose or sinuses.  Having a weak immune system.  Doing a lot of swimming or diving.  Overusing nasal sprays.  Smoking.  What are the signs or symptoms? The main symptoms of this condition are pain and a feeling of pressure around the affected sinuses. Other symptoms include:  Upper toothache.  Earache.  Headache.  Bad breath.  Decreased sense of smell and taste.  A cough that may get worse at night.  Fatigue.  Fever.  Thick drainage from your nose. The drainage is often green and  it may contain pus (purulent).  Stuffy nose or congestion.  Postnasal drip. This is when extra mucus collects in the throat or back of the nose.  Swelling and warmth over the affected sinuses.  Sore throat.  Sensitivity to light.  How is this diagnosed? This condition is diagnosed based on symptoms, a medical history, and a physical exam. To find out if your condition is acute or chronic, your health care provider may:  Look in your nose for signs of nasal polyps.  Tap over the affected sinus to check for signs of infection.  View the inside of your sinuses using an imaging device that has a light attached (endoscope).  If your health care provider suspects that you have chronic sinusitis, you may also:  Be tested for allergies.  Have a sample of mucus taken from your nose (nasal culture) and checked for bacteria.  Have a mucus sample examined to see if your sinusitis is related to an allergy.  If your sinusitis does not respond to treatment and it lasts longer than 8 weeks, you may have an MRI or CT scan to check your sinuses. These scans also help to determine how severe your infection is. In rare cases, a bone biopsy may be done to rule out more serious types of fungal sinus disease. How is this treated? Treatment for sinusitis depends on the cause and whether your condition is chronic or acute. If a virus is causing your sinusitis, your symptoms will go away on their own within 10 days. You may be given medicines to relieve your symptoms,   including:  Topical nasal decongestants. They shrink swollen nasal passages and let mucus drain from your sinuses.  Antihistamines. These drugs block inflammation that is triggered by allergies. This can help to ease swelling in your nose and sinuses.  Topical nasal corticosteroids. These are nasal sprays that ease inflammation and swelling in your nose and sinuses.  Nasal saline washes. These rinses can help to get rid of thick mucus in  your nose.  If your condition is caused by bacteria, you will be given an antibiotic medicine. If your condition is caused by a fungus, you will be given an antifungal medicine. Surgery may be needed to correct underlying conditions, such as narrow nasal passages. Surgery may also be needed to remove polyps. Follow these instructions at home: Medicines  Take, use, or apply over-the-counter and prescription medicines only as told by your health care provider. These may include nasal sprays.  If you were prescribed an antibiotic medicine, take it as told by your health care provider. Do not stop taking the antibiotic even if you start to feel better. Hydrate and Humidify  Drink enough water to keep your urine clear or pale yellow. Staying hydrated will help to thin your mucus.  Use a cool mist humidifier to keep the humidity level in your home above 50%.  Inhale steam for 10-15 minutes, 3-4 times a day or as told by your health care provider. You can do this in the bathroom while a hot shower is running.  Limit your exposure to cool or dry air. Rest  Rest as much as possible.  Sleep with your head raised (elevated).  Make sure to get enough sleep each night. General instructions  Apply a warm, moist washcloth to your face 3-4 times a day or as told by your health care provider. This will help with discomfort.  Wash your hands often with soap and water to reduce your exposure to viruses and other germs. If soap and water are not available, use hand sanitizer.  Do not smoke. Avoid being around people who are smoking (secondhand smoke).  Keep all follow-up visits as told by your health care provider. This is important. Contact a health care provider if:  You have a fever.  Your symptoms get worse.  Your symptoms do not improve within 10 days. Get help right away if:  You have a severe headache.  You have persistent vomiting.  You have pain or swelling around your face or  eyes.  You have vision problems.  You develop confusion.  Your neck is stiff.  You have trouble breathing. This information is not intended to replace advice given to you by your health care provider. Make sure you discuss any questions you have with your health care provider. Document Released: 06/16/2005 Document Revised: 02/10/2016 Document Reviewed: 04/11/2015 Elsevier Interactive Patient Education  2018 [employer].     IF you received an x-ray today, you will receive an invoice from [facility]. Please contact [facility] at [phone] with questions or concerns regarding your invoice.   IF you received labwork today, you will receive an invoice from [facility]. Please contact [facility] at [phone] with questions or concerns regarding your invoice.   Our billing staff will not be able to assist you with questions regarding bills from these companies.  You will be contacted with the lab results as soon as they are available. The fastest way to get your results is to activate your My Chart account. Instructions are located on the last page  of this paperwork. If you have not heard from [place] regarding the results in 2 weeks, please contact this office.

## 2017-08-07 NOTE — Progress Notes (Signed)
   MRN: [id number] DOB: 1960/09/16  Subjective:   Luis Mitchell is a 57 y.o. male presenting for 2 week history of sinus congestion, sinus pressure especially behind his eyes, bilateral ear fullness. Has tried otc nasal sprays with minimal relief. Denies fever, sore throat, cough, chest pain. Has HTN, managed with amlodipine. Denies smoking cigarettes.  Luis Mitchell has a current medication list which includes the following prescription(s): amlodipine, clopidogrel, diphenhydramine, losartan, metoprolol succinate, mometasone, rosuvastatin, and ipratropium. Also is allergic to aspirin and nsaids.  Luis Mitchell  has a past medical history of Cardiomyopathy [facility]), Coronary atherosclerosis of native coronary artery, HTN (hypertension), Hyperlipidemia, Mild dilation of ascending aorta ([place]), and Myocardial infarction ([place]) (06/20/2006). Also  has a past surgical history that includes None and Coronary stent placement.  Objective:   Vitals: BP 130/80   Pulse 73   Temp 98.1 F (36.7 C) (Oral)   Resp 18   Ht 5' 7.5" (1.715 m)   Wt 279 lb (126.6 kg)   SpO2 98%   BMI 43.05 kg/m   BP Readings from Last 3 Encounters:  08/07/17 130/80  07/10/17 132/84  06/19/17 (!) 142/86    Physical Exam  Constitutional: He is oriented to person, place, and time. He appears well-developed and well-nourished.  HENT:  TM's intact bilaterally, no effusions or erythema. Nasal turbinates erythematous, nasal passages minimally patent. Bilateral maxillary sinus tenderness. Oropharynx with mild post-nasal drainage, mucous membranes moist.    Eyes: Right eye exhibits no discharge. Left eye exhibits no discharge.  Neck: Normal range of motion. Neck supple.  Cardiovascular: Normal rate.  Pulmonary/Chest: Effort normal.  Lymphadenopathy:    He has no cervical adenopathy.  Neurological: He is alert and oriented to person, place, and time.  Skin: Skin is warm and dry.  Psychiatric: He has a normal mood and affect.   Assessment and  Plan :   Non-seasonal allergic rhinitis due to other allergic trigger  Acute non-recurrent sinusitis of other sinus  Essential hypertension  Patient has chronic allergies, is very consistent with his Zyrtec, hydration. Will use amoxicillin to address secondary sinusitis. Use Sudafed low dose to help with congestion. Counseled patient on potential for adverse effects with medications prescribed today, patient verbalized understanding. Return-to-clinic precautions discussed, patient verbalized understanding.   Jaynee Eagles, PA-C [facility] [phone] 08/07/2017  11:46 AM

## 2017-09-21 ENCOUNTER — Telehealth: Payer: Self-pay | Admitting: Cardiovascular Disease

## 2017-09-21 MED ORDER — LISINOPRIL 40 MG PO TABS: 40.0000 mg | ORAL_TABLET | Freq: Every day | ORAL | 3 refills | Status: DC

## 2017-09-21 NOTE — Telephone Encounter (Signed)
I spoke with pt's wife and gave her information from pharmacist. Pt is out of losartan. Will send prescription for lisinopril to [facility] on [address].

## 2017-09-21 NOTE — Telephone Encounter (Signed)
Pt c/o medication issue:  1. Name of [place]  2. How are you currently taking this medication (dosage and times per day)? 100 mg once a day   3. Are you having a reaction (difficulty breathing--STAT)? no  4. What is your medication issue?Recall

## 2017-09-21 NOTE — Telephone Encounter (Signed)
Do not see that patient has tried an ACEi. With ARB recalls and back order would recommend try lisinopril 40mg  daily. Monitor pressures and call with any issues/changes for 3-4 weeks after change.

## 2017-09-28 ENCOUNTER — Encounter: Payer: Self-pay | Admitting: Physician Assistant

## 2018-05-25 NOTE — Progress Notes (Signed)
Chief Complaint  Patient presents with  . Follow-up    CAD   History of Present Illness: 57 yo Mitchell with history of CAD and HTN here today for cardiac follow up. He has been followed in the past by Dr. Lia Foyer. I saw him in the office in 2014. Cardiac cath 2007 with 90% diagonal, 50% Circumflex, 100% mid RCA. A bare metal stent was placed in the mid RCA. He has had no cardiac cath since 2007. Nuclear stress test March 2016 with no ischemia. Echo March 2016 with LVEF=45-50%, no significant valve disease. Echo November 2018 with LVEF=45%. Hypokinesis of the inferior wall. Grade 2 diastolic dysfunction.   He is here today for follow up. The patient denies any chest pain, dyspnea, palpitations, lower extremity edema, orthopnea, PND, dizziness, near syncope or syncope.     Primary Care Physician: Alroy Dust, L.Marlou Sa, MD   Past Medical History:  Diagnosis Date  . Cardiomyopathy ([place])    a. EF 45-50% by echo 08/2014.  Marland Kitchen Coronary atherosclerosis of native coronary artery    a. MI s/p BMS to [facility] 2007 with residual 90% diagonal, 50% Cx disease. b. stress test 08/2014 without ischemia.  Marland Kitchen HTN (hypertension)   . Hyperlipidemia   . Mild dilation of ascending aorta (HCC)   . Myocardial infarction [facility]) 06/20/2006    Past Surgical History:  Procedure Laterality Date  . CORONARY STENT PLACEMENT    . None      Current Outpatient Medications  Medication Sig Dispense Refill  . amLODipine (NORVASC) 10 MG tablet TAKE ONE TABLET BY MOUTH ONCE DAILY 90 tablet 3  . cetirizine (ZYRTEC) 10 MG tablet Take 1 tablet (10 mg total) by mouth daily. 30 tablet 11  . clopidogrel (PLAVIX) 75 MG tablet TAKE ONE TABLET BY MOUTH ONCE DAILY 90 tablet 3  . diphenhydrAMINE (BENADRYL) 25 MG tablet Take 1 tab as needed for allergic reactions    . ipratropium (ATROVENT) 0.03 % nasal spray Place 2 sprays into both nostrils 2 (two) times daily. 30 mL 0  . losartan (COZAAR) 25 MG tablet Take 25 mg by mouth daily.    .  metoprolol succinate (TOPROL-XL) 100 MG 24 hr tablet TAKE ONE TABLET BY MOUTH ONCE DAILY 90 tablet 3  . mometasone (NASONEX) 50 MCG/ACT nasal spray USE TWO SPRAY(S) IN EACH NOSTRIL ONCE DAILY 17 g 2  . pseudoephedrine (SUDAFED) 30 MG tablet Take 1 tablet (30 mg total) by mouth every 8 (eight) hours as needed for congestion. 30 tablet 0  . rosuvastatin (CRESTOR) 40 MG tablet Take 1 tablet (40 mg total) daily by mouth. 90 tablet 3   No current facility-administered medications for this visit.     Allergies  Allergen Reactions  . Aspirin Swelling  . Nsaids Swelling    Social History   Socioeconomic History  . Marital status: Married    Spouse name: Not on file  . Number of children: 2  . Years of education: Not on file  . Highest education level: Not on file  Occupational History  . Occupation: Games developer  Social Needs  . Financial resource strain: Not on file  . Food insecurity:    Worry: Not on file    Inability: Not on file  . Transportation needs:    Medical: Not on file    Non-medical: Not on file  Tobacco Use  . Smoking status: Former Smoker    Types: Cigars  . Smokeless tobacco: Never Used  Substance and Sexual Activity  .  Alcohol use: Yes    Alcohol/week: 42.0 standard drinks    Types: 42 Standard drinks or equivalent per week    Comment: Drinks 4 per day  . Drug use: No  . Sexual activity: Not on file  Lifestyle  . Physical activity:    Days per week: Not on file    Minutes per session: Not on file  . Stress: Not on file  Relationships  . Social connections:    Talks on phone: Not on file    Gets together: Not on file    Attends religious service: Not on file    Active member of club or organization: Not on file    Attends meetings of clubs or organizations: Not on file    Relationship status: Not on file  . Intimate partner violence:    Fear of current or ex partner: Not on file    Emotionally abused: Not on file    Physically abused: Not on file     Forced sexual activity: Not on file  Other Topics Concern  . Not on file  Social History Narrative  . Not on file    Family History  Problem Relation Age of Onset  . Heart attack Father   . Colon cancer Neg Hx   . Colon polyps Neg Hx   . Esophageal cancer Neg Hx   . Kidney disease Neg Hx     Review of Systems:  As stated in the HPI and otherwise negative.   BP (!) 144/88   Pulse 73   Ht 5' 7.5" (1.715 m)   Wt 273 lb (123.8 kg)   SpO2 98%   BMI 42.13 kg/m   Physical Examination: General: Well developed, well nourished, NAD  HEENT: OP clear, mucus membranes moist  SKIN: warm, dry. No rashes. Neuro: No focal deficits  Musculoskeletal: Muscle strength 5/5 all ext  Psychiatric: Mood and affect normal  Neck: No JVD, no carotid bruits, no thyromegaly, no lymphadenopathy.  Lungs:Clear bilaterally, no wheezes, rhonci, crackles Cardiovascular: Regular rate and rhythm. No murmurs, gallops or rubs. Abdomen:Soft. Bowel sounds present. Non-tender.  Extremities: No lower extremity edema. Pulses are 2 + in the bilateral DP/PT.  Echo November 2018: Left ventricle: The cavity size was normal. Wall thickness was   increased in a pattern of mild LVH. Systolic function was mildly   to moderately reduced. The estimated ejection fraction was in the   range of 40% to 45%. There is hypokinesis of the inferior   myocardium. Features are consistent with a pseudonormal left   ventricular filling pattern, with concomitant abnormal relaxation   and increased filling pressure (grade 2 diastolic dysfunction). - Aortic root: The aortic root was mildly dilated. - Left atrium: The atrium was moderately dilated. - Right ventricle: The cavity size was mildly dilated. - Right atrium: The atrium was mildly dilated.  Impressions:  - Hypokinesis of the inferior basal wall with overall mild to   moderate LV dysfunction; moderate diastolic dysfunction; mildly   dilated aortic root; moderate LAE; mild  RAE and RVE.  EKG:  EKG is ordered today. The ekg ordered today demonstrates NSR, rate 73 bpm. Inferior Q waves. Unchanged from prior EKG  Recent Labs: 06/19/2017: ALT 24 07/10/2017: BUN 20; Creatinine, Ser 0.93; Potassium 4.8; Sodium 136   Lipid Panel    Component Value Date/Time   CHOL 143 06/19/2017 0842   TRIG 124 06/19/2017 0842   HDL 46 06/19/2017 0842   CHOLHDL 3.1 06/19/2017 1740  CHOLHDL 2.8 06/09/2016 0859   VLDL 15 06/09/2016 0859   LDLCALC 72 06/19/2017 0842   LDLDIRECT 152.7 10/24/2009 0000     Wt Readings from Last 3 Encounters:  05/26/18 273 lb (123.8 kg)  08/07/17 279 lb (126.6 kg)  05/05/17 280 lb (127 kg)     Other studies Reviewed: Additional studies/ records that were reviewed today include: . Review of the above records demonstrates:   Assessment and Plan:   1. HTN: BP has been controlled at home. No changes today.  Will check BMET  2. CAD without angina: No chest pain. LVEF=45-50% by echo march 2016. Will continue Plavix, statin and beta blocker.   3. Hyperlipidemia: LDL at goal in December 2018. Continue statin.Will check lipids and LFTs today  4. Ischemic cardiomyopathy: LVEF=45% by echo November 2018. Inferior wall hypokinesis. Prior inferior MI. Will continue Losartan and Toprol.    The following changes have been made:  no change  Labs/ tests ordered today include:   Orders Placed This Encounter  Procedures  . Comp Met (CMET)  . Lipid Profile  . EKG 12-Lead    Disposition:   FU with me in 12 months  Signed, Lauree Chandler, MD 05/26/2018 8:31 AM    [facility] [place], [place], [place]  [address] Phone: [phone]; Fax: [phone]

## 2018-05-26 ENCOUNTER — Encounter: Payer: Self-pay | Admitting: Cardiovascular Disease

## 2018-05-26 ENCOUNTER — Ambulatory Visit (INDEPENDENT_AMBULATORY_CARE_PROVIDER_SITE_OTHER): Payer: BLUE CROSS/BLUE SHIELD | Admitting: Cardiovascular Disease

## 2018-05-26 VITALS — BP 144/88 | HR 73 | Ht 67.5 in | Wt 273.0 lb

## 2018-05-26 DIAGNOSIS — I10 Essential (primary) hypertension: Secondary | ICD-10-CM

## 2018-05-26 DIAGNOSIS — I255 Ischemic cardiomyopathy: Secondary | ICD-10-CM | POA: Diagnosis not present

## 2018-05-26 DIAGNOSIS — I2510 Atherosclerotic heart disease of native coronary artery without angina pectoris: Secondary | ICD-10-CM

## 2018-05-26 DIAGNOSIS — E785 Hyperlipidemia, unspecified: Secondary | ICD-10-CM | POA: Diagnosis not present

## 2018-05-26 LAB — COMPREHENSIVE METABOLIC PANEL
ALBUMIN: 4.5 g/dL (ref 3.5–5.5)
ALT: 30 IU/L (ref 0–44)
AST: 27 IU/L (ref 0–40)
Albumin/Globulin Ratio: 1.7 (ref 1.2–2.2)
Alkaline Phosphatase: 83 IU/L (ref 39–117)
BILIRUBIN TOTAL: 0.5 mg/dL (ref 0.0–1.2)
BUN / CREAT RATIO: 19 (ref 9–20)
BUN: 17 mg/dL (ref 6–24)
CALCIUM: 9.8 mg/dL (ref 8.7–10.2)
CHLORIDE: 94 mmol/L — AB (ref 96–106)
CO2: 23 mmol/L (ref 20–29)
CREATININE: 0.89 mg/dL (ref 0.76–1.27)
GFR, EST AFRICAN AMERICAN: 110 mL/min/{1.73_m2} (ref >59)
GFR, EST NON AFRICAN AMERICAN: 95 mL/min/{1.73_m2} (ref >59)
GLUCOSE: 117 mg/dL — AB (ref 65–99)
Globulin, Total: 2.6 g/dL (ref 1.5–4.5)
Potassium: 4.4 mmol/L (ref 3.5–5.2)
Sodium: 135 mmol/L (ref 134–144)
TOTAL PROTEIN: 7.1 g/dL (ref 6.0–8.5)

## 2018-05-26 LAB — LIPID PANEL
CHOL/HDL RATIO: 5 ratio (ref 0.0–5.0)
Cholesterol, Total: 250 mg/dL — ABNORMAL HIGH (ref 100–199)
HDL: 50 mg/dL (ref >39)
LDL CALC: 166 mg/dL — AB (ref 0–99)
Triglycerides: 170 mg/dL — ABNORMAL HIGH (ref 0–149)
VLDL CHOLESTEROL CAL: 34 mg/dL (ref 5–40)

## 2018-05-26 NOTE — Patient Instructions (Signed)
Medication Instructions:  Your physician recommends that you continue on your current medications as directed. Please refer to the Current Medication list given to you today.  If you need a refill on your cardiac medications before your next appointment, please call your pharmacy.   Lab work: Lab work to be done today--CMET and Lipid If you have labs (blood work) drawn today and your tests are completely normal, you will receive your results only by: Marland Kitchen MyChart Message (if you have MyChart) OR . A paper copy in the mail If you have any lab test that is abnormal or we need to change your treatment, we will call you to review the results.  Testing/Procedures: none  Follow-Up: At [facility], you and your health needs are our priority.  As part of our continuing mission to provide you with exceptional heart care, we have created designated Provider Care Teams.  These Care Teams include your primary Cardiologist (physician) and Advanced Practice Providers (APPs -  Physician Assistants and Nurse Practitioners) who all work together to provide you with the care you need, when you need it. You will need a follow up appointment in 12 months.  Please call our office 2 months in advance to schedule this appointment.  You may see Lauree Chandler, MD  or one of the following Advanced Practice Providers on your designated Care Team:   [place], PA-C Melina Copa, PA-C . Ermalinda Barrios, PA-C  Any Other Special Instructions Will Be Listed Below (If Applicable).

## 2018-05-31 ENCOUNTER — Telehealth: Payer: Self-pay | Admitting: *Deleted

## 2018-05-31 DIAGNOSIS — E785 Hyperlipidemia, unspecified: Secondary | ICD-10-CM

## 2018-05-31 MED ORDER — LISINOPRIL 40 MG PO TABS: 40.0000 mg | ORAL_TABLET | Freq: Every day | ORAL | 3 refills | Status: DC

## 2018-05-31 MED ORDER — ROSUVASTATIN CALCIUM 40 MG PO TABS: 40.0000 mg | ORAL_TABLET | Freq: Every day | ORAL | 3 refills | Status: DC

## 2018-05-31 NOTE — Telephone Encounter (Signed)
I would stop Losartan and continue Lisinopril 40 mg daily. Restart Crestor and check lipids and LFTS in 12 weeks. cdm

## 2018-05-31 NOTE — Addendum Note (Signed)
Addended by: Thompson Grayer on: 05/31/2018 02:25 PM   Modules accepted: Orders

## 2018-05-31 NOTE — Telephone Encounter (Signed)
-----   Message from Burnell Blanks, MD sent at 05/26/2018  3:53 PM EST ----- His LDL was at goal last year but now is 166. Can we check and make sure he has been taking his statin? I would not expect to see such a big jump in his LDL if he has been taking his Crestor. Thanks, chris

## 2018-05-31 NOTE — Telephone Encounter (Signed)
I placed call to pt and left message to call office.  

## 2018-05-31 NOTE — Telephone Encounter (Signed)
Per phone note dated 09/21/17 losartan was stopped due to recall and pt was started on lisinopril 40 mg daily.  Will forward to Dr. Angelena Form for review/recommendations.

## 2018-05-31 NOTE — Telephone Encounter (Signed)
I spoke with pt. He checked his medications and he has not been taking Crestor.  He is taking amlodipine, Plavix and metoprolol as listed. He states his dose of Losartan is 100 mg daily and he is also taking lisinopril 40 mg daily.  Our medication list has Losartan 25 mg daily and does not list lisinopril as an active medication. I told pt he should resume Crestor 40 mg daily and I would schedule him for blood work in about 12 weeks.  I will call him back after researching his medications.

## 2018-05-31 NOTE — Telephone Encounter (Signed)
I spoke with pt and gave him instructions from Dr. Angelena Form.  Will send prescription for Rosuvastatin to [facility] on [address]. He will come in for lipid and liver profile on 08/24/18

## 2018-08-24 ENCOUNTER — Other Ambulatory Visit: Payer: BLUE CROSS/BLUE SHIELD

## 2018-09-27 ENCOUNTER — Other Ambulatory Visit: Payer: Self-pay | Admitting: Cardiovascular Disease

## 2019-01-14 ENCOUNTER — Other Ambulatory Visit: Payer: Self-pay | Admitting: Cardiovascular Disease

## 2019-02-25 ENCOUNTER — Other Ambulatory Visit: Payer: Self-pay | Admitting: Cardiovascular Disease

## 2019-04-16 ENCOUNTER — Other Ambulatory Visit: Payer: Self-pay | Admitting: Cardiovascular Disease

## 2019-08-07 NOTE — Progress Notes (Signed)
Chief Complaint  Patient presents with  . Follow-up    CAD   History of Present Illness: 59 yo male with history of CAD and HTN here today for cardiac follow up. Cardiac cath 2007 with 90% diagonal, 50% Circumflex, 100% mid RCA. A bare metal stent was placed in the mid RCA. He has had no cardiac cath since 2007. Nuclear stress test March 2016 with no ischemia. Echo March 2016 with LVEF=45-50%, no significant valve disease. Echo November 2018 with LVEF=45%. Hypokinesis of the inferior wall. Grade 2 diastolic dysfunction.   He is here today for follow up. The patient denies any chest pain, dyspnea, palpitations, lower extremity edema, orthopnea, PND, dizziness, near syncope or syncope.     Primary Care Physician: Alroy Dust, L.Marlou Sa, MD   Past Medical History:  Diagnosis Date  . Cardiomyopathy ([place])    a. EF 45-50% by echo 08/2014.  Marland Kitchen Coronary atherosclerosis of native coronary artery    a. MI s/p BMS to [facility] 2007 with residual 90% diagonal, 50% Cx disease. b. stress test 08/2014 without ischemia.  Marland Kitchen HTN (hypertension)   . Hyperlipidemia   . Mild dilation of ascending aorta ([facility])   . Myocardial infarction [facility]) 06/20/2006    Past Surgical History:  Procedure Laterality Date  . CORONARY STENT PLACEMENT    . None      Current Outpatient Medications  Medication Sig Dispense Refill  . amLODipine (NORVASC) 10 MG tablet Take 1 tablet by mouth once daily 90 tablet 2  . cetirizine (ZYRTEC) 10 MG tablet Take 1 tablet (10 mg total) by mouth daily. 30 tablet 11  . clopidogrel (PLAVIX) 75 MG tablet Take 1 tablet (75 mg total) by mouth daily. Please call [phone] to schedule your yearly f/u. Thank you. 90 tablet 0  . diphenhydrAMINE (BENADRYL) 25 MG tablet Take 1 tab as needed for allergic reactions    . ipratropium (ATROVENT) 0.03 % nasal spray Place 2 sprays into both nostrils 2 (two) times daily. 30 mL 0  . lisinopril (PRINIVIL,ZESTRIL) 40 MG tablet Take 1 tablet (40 mg total) by mouth  daily. 90 tablet 3  . metoprolol succinate (TOPROL-XL) 100 MG 24 hr tablet Take 1 tablet by mouth once daily 90 tablet 3  . mometasone (NASONEX) 50 MCG/ACT nasal spray USE TWO SPRAY(S) IN EACH NOSTRIL ONCE DAILY 17 g 2  . pseudoephedrine (SUDAFED) 30 MG tablet Take 1 tablet (30 mg total) by mouth every 8 (eight) hours as needed for congestion. 30 tablet 0  . rosuvastatin (CRESTOR) 40 MG tablet Take 1 tablet (40 mg total) by mouth daily. 90 tablet 3   No current facility-administered medications for this visit.    Allergies  Allergen Reactions  . Aspirin Swelling  . Nsaids Swelling  . Tolmetin Swelling    Social History   Socioeconomic History  . Marital status: Married    Spouse name: Not on file  . Number of children: 2  . Years of education: Not on file  . Highest education level: Not on file  Occupational History  . Occupation: Carpenter  Tobacco Use  . Smoking status: Former Smoker    Types: Cigars  . Smokeless tobacco: Never Used  Substance and Sexual Activity  . Alcohol use: Yes    Alcohol/week: 42.0 standard drinks    Types: 42 Standard drinks or equivalent per week    Comment: Drinks 4 per day  . Drug use: No  . Sexual activity: Not on file  Other Topics Concern  .  Not on file  Social History Narrative  . Not on file   Social Determinants of Health   Financial Resource Strain:   . Difficulty of Paying Living Expenses: Not on file  Food Insecurity:   . Worried About Charity fundraiser in the Last Year: Not on file  . Ran Out of Food in the Last Year: Not on file  Transportation Needs:   . Lack of Transportation (Medical): Not on file  . Lack of Transportation (Non-Medical): Not on file  Physical Activity:   . Days of Exercise per Week: Not on file  . Minutes of Exercise per Session: Not on file  Stress:   . Feeling of Stress : Not on file  Social Connections:   . Frequency of Communication with Friends and Family: Not on file  . Frequency of Social  Gatherings with Friends and Family: Not on file  . Attends Religious Services: Not on file  . Active Member of Clubs or Organizations: Not on file  . Attends Archivist Meetings: Not on file  . Marital Status: Not on file  Intimate Partner Violence:   . Fear of Current or Ex-Partner: Not on file  . Emotionally Abused: Not on file  . Physically Abused: Not on file  . Sexually Abused: Not on file    Family History  Problem Relation Age of Onset  . Heart attack Father   . Colon cancer Neg Hx   . Colon polyps Neg Hx   . Esophageal cancer Neg Hx   . Kidney disease Neg Hx     Review of Systems:  As stated in the HPI and otherwise negative.   BP 128/84   Pulse 67   Ht 5' 7.5" (1.715 m)   Wt 285 lb 1.9 oz (129.3 kg)   SpO2 98%   BMI 44.00 kg/m   Physical Examination: General: Well developed, well nourished, NAD  HEENT: OP clear, mucus membranes moist  SKIN: warm, dry. No rashes. Neuro: No focal deficits  Musculoskeletal: Muscle strength 5/5 all ext  Psychiatric: Mood and affect normal  Neck: No JVD, no carotid bruits, no thyromegaly, no lymphadenopathy.  Lungs:Clear bilaterally, no wheezes, rhonci, crackles Cardiovascular: Regular rate and rhythm. No murmurs, gallops or rubs. Abdomen:Soft. Bowel sounds present. Non-tender.  Extremities: No lower extremity edema. Pulses are 2 + in the bilateral DP/PT.  Echo November 2018: Left ventricle: The cavity size was normal. Wall thickness was   increased in a pattern of mild LVH. Systolic function was mildly   to moderately reduced. The estimated ejection fraction was in the   range of 40% to 45%. There is hypokinesis of the inferior   myocardium. Features are consistent with a pseudonormal left   ventricular filling pattern, with concomitant abnormal relaxation   and increased filling pressure (grade 2 diastolic dysfunction). - Aortic root: The aortic root was mildly dilated. - Left atrium: The atrium was moderately  dilated. - Right ventricle: The cavity size was mildly dilated. - Right atrium: The atrium was mildly dilated.  Impressions:  - Hypokinesis of the inferior basal wall with overall mild to   moderate LV dysfunction; moderate diastolic dysfunction; mildly   dilated aortic root; moderate LAE; mild RAE and RVE.  EKG:  EKG is ordered today. The ekg ordered today demonstrates NSR, rate 67 bpm  Recent Labs: No results found for requested labs within last 8760 hours.   Lipid Panel    Component Value Date/Time   CHOL 250 (H)  05/26/2018 0827   TRIG 170 (H) 05/26/2018 0827   HDL 50 05/26/2018 0827   CHOLHDL 5.0 05/26/2018 0827   CHOLHDL 2.8 06/09/2016 0859   VLDL 15 06/09/2016 0859   LDLCALC 166 (H) 05/26/2018 0827   LDLDIRECT 152.7 10/24/2009 0000     Wt Readings from Last 3 Encounters:  08/08/19 285 lb 1.9 oz (129.3 kg)  05/26/18 273 lb (123.8 kg)  08/07/17 279 lb (126.6 kg)     Other studies Reviewed: Additional studies/ records that were reviewed today include: . Review of the above records demonstrates:   Assessment and Plan:   1. HTN: BP is well controlled. Check BMET today.   2. CAD without angina: He has no chest pain. Continue Plavix, statin and beta blocker.    3. Hyperlipidemia: LDL not a goal in 2019 but now back on Crestor. Will check lipids and LFTs today. Continue statin.   4. Ischemic cardiomyopathy: LVEF=45% by echo November 2018. Inferior wall hypokinesis. Prior inferior MI. Continue beta blocker and ARB  The following changes have been made:  no change  Labs/ tests ordered today include:   Orders Placed This Encounter  Procedures  . Basic metabolic panel  . Lipid panel  . Hepatic function panel  . EKG 12-Lead    Disposition:   FU with me in 12 months  Signed, Lauree Chandler, MD 08/08/2019 10:24 AM    [facility] [address], [place], [place]  [address] Phone: [phone]; Fax: [phone]

## 2019-08-08 ENCOUNTER — Encounter: Payer: Self-pay | Admitting: Cardiovascular Disease

## 2019-08-08 ENCOUNTER — Other Ambulatory Visit: Payer: Self-pay

## 2019-08-08 ENCOUNTER — Ambulatory Visit (INDEPENDENT_AMBULATORY_CARE_PROVIDER_SITE_OTHER): Payer: BC Managed Care – PPO | Admitting: Cardiovascular Disease

## 2019-08-08 VITALS — BP 128/84 | HR 67 | Ht 67.5 in | Wt 285.1 lb

## 2019-08-08 DIAGNOSIS — E785 Hyperlipidemia, unspecified: Secondary | ICD-10-CM | POA: Diagnosis not present

## 2019-08-08 DIAGNOSIS — I255 Ischemic cardiomyopathy: Secondary | ICD-10-CM | POA: Diagnosis not present

## 2019-08-08 DIAGNOSIS — I10 Essential (primary) hypertension: Secondary | ICD-10-CM | POA: Diagnosis not present

## 2019-08-08 DIAGNOSIS — I2510 Atherosclerotic heart disease of native coronary artery without angina pectoris: Secondary | ICD-10-CM

## 2019-08-08 LAB — BASIC METABOLIC PANEL
BUN/Creatinine Ratio: 13 (ref 9–20)
BUN: 12 mg/dL (ref 6–24)
CO2: 20 mmol/L (ref 20–29)
Calcium: 8.7 mg/dL (ref 8.7–10.2)
Chloride: 99 mmol/L (ref 96–106)
Creatinine, Ser: 0.92 mg/dL (ref 0.76–1.27)
GFR calc Af Amer: 105 mL/min/{1.73_m2} (ref >59)
GFR calc non Af Amer: 91 mL/min/{1.73_m2} (ref >59)
Glucose: 119 mg/dL — ABNORMAL HIGH (ref 65–99)
Potassium: 4.6 mmol/L (ref 3.5–5.2)
Sodium: 137 mmol/L (ref 134–144)

## 2019-08-08 LAB — HEPATIC FUNCTION PANEL
ALT: 20 IU/L (ref 0–44)
AST: 19 IU/L (ref 0–40)
Albumin: 4.3 g/dL (ref 3.8–4.9)
Alkaline Phosphatase: 86 IU/L (ref 39–117)
Bilirubin Total: 0.4 mg/dL (ref 0.0–1.2)
Bilirubin, Direct: 0.13 mg/dL (ref 0.00–0.40)
Total Protein: 6.6 g/dL (ref 6.0–8.5)

## 2019-08-08 LAB — LIPID PANEL
Chol/HDL Ratio: 3 ratio (ref 0.0–5.0)
Cholesterol, Total: 138 mg/dL (ref 100–199)
HDL: 46 mg/dL (ref >39)
LDL Chol Calc (NIH): 74 mg/dL (ref 0–99)
Triglycerides: 94 mg/dL (ref 0–149)
VLDL Cholesterol Cal: 18 mg/dL (ref 5–40)

## 2019-08-08 NOTE — Patient Instructions (Signed)
Medication Instructions:  No changes *If you need a refill on your cardiac medications before your next appointment, please call your pharmacy*  Lab Work: Today: bmet, lipid panel, liver function panel If you have labs (blood work) drawn today and your tests are completely normal, you will receive your results only by: Marland Kitchen MyChart Message (if you have MyChart) OR . A paper copy in the mail If you have any lab test that is abnormal or we need to change your treatment, we will call you to review the results.  Testing/Procedures: None ordered  Follow-Up: At [facility], you and your health needs are our priority.  As part of our continuing mission to provide you with exceptional heart care, we have created designated Provider Care Teams.  These Care Teams include your primary Cardiologist (physician) and Advanced Practice Providers (APPs -  Physician Assistants and Nurse Practitioners) who all work together to provide you with the care you need, when you need it.  Your next appointment:   12 month(s)  The format for your next appointment:   Either In Person or Virtual  Provider:   You may see Lauree Chandler, MD or one of the following Advanced Practice Providers on your designated Care Team:    Melina Copa, PA-C  Ermalinda Barrios, PA-C   Other Instructions

## 2019-09-03 ENCOUNTER — Other Ambulatory Visit: Payer: Self-pay | Admitting: Cardiovascular Disease

## 2019-10-08 ENCOUNTER — Other Ambulatory Visit: Payer: Self-pay | Admitting: Cardiovascular Disease

## 2020-05-02 ENCOUNTER — Telehealth: Payer: Self-pay | Admitting: *Deleted

## 2020-05-02 NOTE — Telephone Encounter (Signed)
° °  [facility] Pre-operative Risk Assessment    HEARTCARE STAFF: - Please ensure there is not already an duplicate clearance open for this procedure. - Under Visit Info/Reason for Call, type in Other and utilize the format Clearance MM/DD/YY or Clearance TBD. Do not use dashes or single digits. - If request is for dental extraction, please clarify the # of teeth to be extracted.  Request for surgical clearance:  1. What type of surgery is being performed? UMBILICAL HERNIA REPAIR w/MESH   2. When is this surgery scheduled? 05/18/20   3. What type of clearance is required (medical clearance vs. Pharmacy clearance to hold med vs. Both)? MEDICAL  4. Are there any medications that need to be held prior to surgery and how long? PLAVIX    5. Practice name and name of physician performing surgery? [facility]; DR. Corena Pilgrim   6. What is the office phone number? [phone]   7.   What is the office fax number? [phone]  8.   Anesthesia type (None, local, MAC, general) ? NOT LISTED   Julaine Hua 05/02/2020, 4:29 PM  _________________________________________________________________   (provider comments below)

## 2020-05-03 NOTE — Telephone Encounter (Signed)
OK to hold Plavix 5-7 days before his planned surgical procedure. Lauree Chandler

## 2020-05-03 NOTE — Telephone Encounter (Signed)
   Primary Cardiologist: Lauree Chandler, MD  Chart reviewed as part of pre-operative protocol coverage. Patient was contacted 05/03/2020 in reference to pre-operative risk assessment for pending surgery as outlined below.  Kainoah Bartosiewicz Bisch was last seen on 08/08/2019 by Dr. Angelena Form.  Since that day, DREAM NODAL has done well without chest pain or significant shortness of breath.  Therefore, based on ACC/AHA guidelines, the patient would be at acceptable risk for the planned procedure without further cardiovascular testing.   The patient was advised that if he develops new symptoms prior to surgery to contact our office to arrange for a follow-up visit, and he verbalized understanding.  I will route this recommendation to the requesting party via Epic fax function and remove from pre-op pool. Please call with questions.  Dr. Angelena Form, please review, would you be ok with the patient to come off of Plavix for 5-7 days prior to the hernia repair and restart as soon as possible afterward.   [place], [place] 05/03/2020, 10:50 AM

## 2020-07-15 ENCOUNTER — Other Ambulatory Visit: Payer: Self-pay | Admitting: Cardiovascular Disease

## 2020-08-17 ENCOUNTER — Other Ambulatory Visit: Payer: Self-pay | Admitting: Cardiovascular Disease

## 2020-09-19 ENCOUNTER — Other Ambulatory Visit: Payer: Self-pay | Admitting: Cardiovascular Disease

## 2020-10-08 ENCOUNTER — Other Ambulatory Visit: Payer: Self-pay | Admitting: Cardiovascular Disease

## 2020-10-08 NOTE — Telephone Encounter (Signed)
   *  STAT* If patient is at the pharmacy, call can be transferred to refill team.   1. Which medications need to be refilled? (please list name of each medication and dose if known) metoprolol succinate (TOPROL-XL) 100 MG 24 hr tablet  2. Which pharmacy/location (including street and city if local pharmacy) is medication to be sent to? [place] ([place]), [place] [address]  3. Do they need a 30 day or 90 day supply? 90 days   Pt made an appt on 11/06/2020 with PA Melina Copa

## 2020-11-05 ENCOUNTER — Encounter: Payer: Self-pay | Admitting: Physician Assistant

## 2020-11-05 NOTE — Progress Notes (Addendum)
Cardiology Office Note    Date:  11/06/2020   ID:  RAYJON WERY, DOB 12/20/1960, MRN [id number]  PCP:  Aurea Graff.Marlou Sa, MD  Cardiologist:  Lauree Chandler, MD  Electrophysiologist:  None   Chief Complaint: f/u CAD, increased SOB   History of Present Illness:   Luis Mitchell is a 60 y.o. male with history of CAD (MI s/p BMS to mRCA 2007 with residual 90% diagonal, 50% Cx disease, nonischemic 08/2014), chronic cardiomyopathy with EF 45-50% in 08/2014, mildly dilated ascending aorta, HTN, HLD who presents for annual f/u. No cath since PCI in 2007. Nuc in 08/2014 showed low risk; prior MI, no ischemia, EF 37%. 2D echo 08/2014 not well visualized, estimated EF 45-50%, grade 1 DD, mildly dilated ascending aorta. (Of note prior EF 50-55% by echo in 2007.) His last echo in 04/2017 showed mild LVH, EF 40-45%, hypokinesis of the inferior myocardium, mildly dilated aortic root, moderately dilated LA, mildly dilated RV/RA. He was started on ARB at that time later changed to ACEi due to the ARB recalls at the time.  He is seen back for routine follow-up today, but is reporting increased SOB, DOE and orthopnea ever since he had Covid about 7 months ago. He was unvaccinated. He states he did receive the antibody infusion. His SOB was quite severe after the initial illness. It has since improved, but never resolved fully improved back to baseline. He has to stop and rest after doing activities like yardwork. He has not had any chest pain. No edema. He felt he has gained weight but his weight is quite variable in Epic - 273 in 2019, 285 in 07/2020, then 277 today. He states he does not snore but indicates that his wife has witnessed him stop breathing or gasp in the middle of the night. He has not had a prior sleep study.  Labwork independently reviewed: 08/2019 LFTs wnl, LDL 74, K 4.6, Cr 0.92 2018 CBC wnl   Past Medical History:  Diagnosis Date  . Coronary atherosclerosis of native coronary artery     a. MI s/p BMS to [facility] 2007 with residual 90% diagonal, 50% Cx disease. b. stress test 08/2014 without ischemia.  . Dilated aortic root ([place])   . HTN (hypertension)   . Hyperlipidemia   . Ischemic cardiomyopathy   . Myocardial infarction [facility]) 06/20/2006    Past Surgical History:  Procedure Laterality Date  . CORONARY STENT PLACEMENT    . None      Current Medications: Current Meds  Medication Sig  . amLODipine (NORVASC) 10 MG tablet Take 1 tablet by mouth once daily  . cetirizine (ZYRTEC) 10 MG tablet Take 1 tablet (10 mg total) by mouth daily.  . clopidogrel (PLAVIX) 75 MG tablet Take 1 tablet (75 mg total) by mouth daily.  . diphenhydrAMINE (BENADRYL) 25 MG tablet Take 1 tab as needed for allergic reactions  . ipratropium (ATROVENT) 0.03 % nasal spray Place 2 sprays into both nostrils 2 (two) times daily.  Marland Kitchen lisinopril (ZESTRIL) 40 MG tablet Take 1 tablet by mouth once daily  . metoprolol succinate (TOPROL-XL) 100 MG 24 hr tablet TAKE 1 TABLET BY MOUTH ONCE DAILY . APPOINTMENT REQUIRED WITH DR [facility] FOR FUTURE REFILLS CALL [phone] OFFICE 2ND ATTEMPT  . mometasone (NASONEX) 50 MCG/ACT nasal spray USE TWO SPRAY(S) IN EACH NOSTRIL ONCE DAILY  . pseudoephedrine (SUDAFED) 30 MG tablet Take 1 tablet (30 mg total) by mouth every 8 (eight) hours as needed for congestion.  Marland Kitchen  rosuvastatin (CRESTOR) 40 MG tablet Take 1 tablet by mouth once daily    Allergies:   Aspirin, Nsaids, and Tolmetin   Social History   Socioeconomic History  . Marital status: Married    Spouse name: Not on file  . Number of children: 2  . Years of education: Not on file  . Highest education level: Not on file  Occupational History  . Occupation: Carpenter  Tobacco Use  . Smoking status: Former Smoker    Types: Cigars  . Smokeless tobacco: Never Used  Vaping Use  . Vaping Use: Never used  Substance and Sexual Activity  . Alcohol use: Yes    Alcohol/week: 42.0 standard drinks    Types: 42  Standard drinks or equivalent per week    Comment: Drinks 4 per day  . Drug use: No  . Sexual activity: Not on file  Other Topics Concern  . Not on file  Social History Narrative  . Not on file   Social Determinants of Health   Financial Resource Strain: Not on file  Food Insecurity: Not on file  Transportation Needs: Not on file  Physical Activity: Not on file  Stress: Not on file  Social Connections: Not on file     Family History:  The patient's family history includes Heart attack in his father. There is no history of Colon cancer, Colon polyps, Esophageal cancer, or Kidney disease.  ROS:   Please see the history of present illness.  All other systems are reviewed and otherwise negative.    EKGs/Labs/Other Studies Reviewed:    Studies reviewed are outlined and summarized above. Reports included below if pertinent.  2D echo 04/2017 - Left ventricle: The cavity size was normal. Wall thickness was  increased in a pattern of mild LVH. Systolic function was mildly  to moderately reduced. The estimated ejection fraction was in the  range of 40% to 45%. There is hypokinesis of the inferior  myocardium. Features are consistent with a pseudonormal left  ventricular filling pattern, with concomitant abnormal relaxation  and increased filling pressure (grade 2 diastolic dysfunction).  - Aortic root: The aortic root was mildly dilated.  - Left atrium: The atrium was moderately dilated.  - Right ventricle: The cavity size was mildly dilated.  - Right atrium: The atrium was mildly dilated.   Impressions:   - Hypokinesis of the inferior basal wall with overall mild to  moderate LV dysfunction; moderate diastolic dysfunction; mildly  dilated aortic root; moderate LAE; mild RAE and RVE.     EKG:  EKG is ordered today, personally reviewed, demonstrating NSR 62bpm, nonspeciifc STTW changes inferiorly similar to prior  Recent Labs: No results found for requested  labs within last 8760 hours.  Recent Lipid Panel    Component Value Date/Time   CHOL 138 08/08/2019 0909   TRIG 94 08/08/2019 0909   HDL 46 08/08/2019 0909   CHOLHDL 3.0 08/08/2019 0909   CHOLHDL 2.8 06/09/2016 0859   VLDL 15 06/09/2016 0859   LDLCALC 74 08/08/2019 0909   LDLDIRECT 152.7 10/24/2009 0000    PHYSICAL EXAM:    VS:  BP 128/86   Pulse 62   Ht 5' 7.5" (1.715 m)   Wt 277 lb (125.6 kg)   SpO2 99%   BMI 42.74 kg/m   BMI: Body mass index is 42.74 kg/m.  GEN: Well nourished, well developed obese male in no acute distress HEENT: normocephalic, atraumatic Neck: no JVD, carotid bruits, or masses Cardiac: RRR; no murmurs, rubs,  or gallops, no edema  Respiratory: coarse BS but generally clear to auscultation bilaterally, normal work of breathing GI: soft, nontender, nondistended, + BS MS: no deformity or atrophy Skin: warm and dry, no rash Neuro:  Alert and Oriented x 3, Strength and sensation are intact, follows commands Psych: euthymic mood, full affect  Wt Readings from Last 3 Encounters:  11/06/20 277 lb (125.6 kg)  08/08/19 285 lb 1.9 oz (129.3 kg)  05/26/18 273 lb (123.8 kg)     ASSESSMENT & PLAN:   1. SOB - constellation of symptoms is concerning for worsening heart failure. Temporally he relays that this occurred after his Covid infection so question whether he sustained any lung injury as well. He was unvaccinated. He is not tachycardic, tachypneic or hypoxic today and chronicity does not really fit for PE. We will undertake labs today including CBC, CMET, TSH, and BNP - tentatively I anticipate trial of low dose Lasix to start with BMET in 1 week. If labs stable at that time, would go ahead and change lisinopril to [facility] with 36hr washout with follow-up thereafter. Continue Toprol. Can consider switching amlodipine to spironolactone down the road. Will obtain 2V CXR and update echocardiogram. If DOE persists despite medical optimization or EF is significantly  worse, may need to pursue updated ischemic evaluation as well.  2. CAD with HLD goal LDL <70 - maintained on Plavix by Dr. Angelena Form. Continue BB and statin as well. Check CBC, CMET, lipid profile today.  3. Ischemic cardiomyopathy, also abnormalities in the right heart/chronic combined CHF - see above for plans with regarding to ACEi and potential eventual transition from amlodipine to spironolactone as well. Given his prior right heart abnormalities on echocardiogram and patient-reported witnessed apnea while sleeping, will also obtain sleep study to evaluate for OSA.  4. Essential HTN - systolic OK but diastolic mildly elevated. Follow in context of the above. Advised to avoid pseudoephedrine.  5. Mildly dilated aortic root - this will be followed by repeat echocardiogram.  6. Suspected sleep apnea - plan sleep study.  Disposition: F/u with myself or Dr. Angelena Form after above testing.  Medication Adjustments/Labs and Tests Ordered: Current medicines are reviewed at length with the patient today.  Concerns regarding medicines are outlined above. Medication changes, Labs and Tests ordered today are summarized above and listed in the Patient Instructions accessible in Encounters.   Signed, Charlie Pitter, PA-C  11/06/2020 9:14 AM    [address], [place],   [address] Phone: [phone]; Fax: [phone]

## 2020-11-06 ENCOUNTER — Ambulatory Visit (INDEPENDENT_AMBULATORY_CARE_PROVIDER_SITE_OTHER): Payer: BC Managed Care – PPO | Admitting: Physician Assistant

## 2020-11-06 ENCOUNTER — Telehealth: Payer: Self-pay | Admitting: *Deleted

## 2020-11-06 ENCOUNTER — Encounter: Payer: Self-pay | Admitting: Physician Assistant

## 2020-11-06 ENCOUNTER — Other Ambulatory Visit: Payer: Self-pay

## 2020-11-06 ENCOUNTER — Ambulatory Visit
Admission: RE | Admit: 2020-11-06 | Discharge: 2020-11-06 | Disposition: A | Discharge status: Home / Self Care | Payer: BC Managed Care – PPO | Source: Ambulatory Visit | Attending: Physician Assistant | Admitting: Physician Assistant

## 2020-11-06 VITALS — BP 128/86 | HR 62 | Ht 67.5 in | Wt 277.0 lb

## 2020-11-06 DIAGNOSIS — I2510 Atherosclerotic heart disease of native coronary artery without angina pectoris: Secondary | ICD-10-CM

## 2020-11-06 DIAGNOSIS — E785 Hyperlipidemia, unspecified: Secondary | ICD-10-CM

## 2020-11-06 DIAGNOSIS — I5042 Chronic combined systolic (congestive) and diastolic (congestive) heart failure: Secondary | ICD-10-CM

## 2020-11-06 DIAGNOSIS — I10 Essential (primary) hypertension: Secondary | ICD-10-CM

## 2020-11-06 DIAGNOSIS — I77810 Thoracic aortic ectasia: Secondary | ICD-10-CM

## 2020-11-06 DIAGNOSIS — I255 Ischemic cardiomyopathy: Secondary | ICD-10-CM

## 2020-11-06 DIAGNOSIS — R29818 Other symptoms and signs involving the nervous system: Secondary | ICD-10-CM

## 2020-11-06 DIAGNOSIS — R0602 Shortness of breath: Secondary | ICD-10-CM

## 2020-11-06 NOTE — Patient Instructions (Addendum)
Medication Instructions:  Your physician has recommended you make the following change in your medication:  1.  STOP Sudafed  *If you need a refill on your cardiac medications before your next appointment, please call your pharmacy*   Lab Work: TODAY:  CMET, LIPID, PRO BNP, CBC, & TSH  If you have labs (blood work) drawn today and your tests are completely normal, you will receive your results only by: Marland Kitchen MyChart Message (if you have MyChart) OR . A paper copy in the mail If you have any lab test that is abnormal or we need to change your treatment, we will call you to review the results.   Testing/Procedures: Your provider recommends that you have a Chest X-ray TODAY.  A chest x-ray takes a picture of the organs and structures inside the chest, including the heart, lungs, and blood vessels. This test can show several things, including, whether the heart is enlarges; whether fluid is building up in the lungs; and whether pacemaker / defibrillator leads are still in place. Please go to [facility] [address] [place], [place] [address] [phone]   Your physician has requested that you have an echocardiogram. Echocardiography is a painless test that uses sound waves to create images of your heart. It provides your doctor with information about the size and shape of your heart and how well your heart's chambers and valves are working. This procedure takes approximately one hour. There are no restrictions for this procedure.    Your physician has recommended that you have a sleep study. This test records several body functions during sleep, including: brain activity, eye movement, oxygen and carbon dioxide blood levels, heart rate and rhythm, breathing rate and rhythm, the flow of air through your mouth and nose, snoring, body muscle movements, and chest and belly movement.  Someone will call to arrange this once they get authorization from your insurance  company.     Follow-Up: At [facility], you and your health needs are our priority.  As part of our continuing mission to provide you with exceptional heart care, we have created designated Provider Care Teams.  These Care Teams include your primary Cardiologist (physician) and Advanced Practice Providers (APPs -  Physician Assistants and Nurse Practitioners) who all work together to provide you with the care you need, when you need it.  We recommend signing up for the patient portal called "MyChart".  Sign up information is provided on this After Visit Summary.  MyChart is used to connect with patients for Virtual Visits (Telemedicine).  Patients are able to view lab/test results, encounter notes, upcoming appointments, etc.  Non-urgent messages can be sent to your provider as well.   To learn more about what you can do with MyChart, go to [email or web address].    Your next appointment:   After Echo  The format for your next appointment:   In Person  Provider:   You may see Lauree Chandler, MD or one of the following Advanced Practice Providers on your designated Care Team:    Melina Copa, [place]    Other Instructions  Patients with heart disease should avoid stimulant-type medicines. This includes medicines like cold/allergy medicines that contain pseudoephedrine or phenylephrine. (Sometimes on the bottle it will say "-D" to indicate the decongestant, which you'll need to avoid.) Please make sure to pay attention to labels.

## 2020-11-06 NOTE — Telephone Encounter (Signed)
-----   Message from Jeanann Lewandowsky, [place] sent at 11/06/2020 10:28 AM EDT ----- Pt needs a split night sleep study

## 2020-11-07 ENCOUNTER — Telehealth: Payer: Self-pay | Admitting: Cardiovascular Disease

## 2020-11-07 DIAGNOSIS — E785 Hyperlipidemia, unspecified: Secondary | ICD-10-CM

## 2020-11-07 DIAGNOSIS — I2510 Atherosclerotic heart disease of native coronary artery without angina pectoris: Secondary | ICD-10-CM

## 2020-11-07 DIAGNOSIS — Z79899 Other long term (current) drug therapy: Secondary | ICD-10-CM

## 2020-11-07 DIAGNOSIS — I255 Ischemic cardiomyopathy: Secondary | ICD-10-CM

## 2020-11-07 DIAGNOSIS — R911 Solitary pulmonary nodule: Secondary | ICD-10-CM

## 2020-11-07 DIAGNOSIS — R0602 Shortness of breath: Secondary | ICD-10-CM

## 2020-11-07 LAB — COMPREHENSIVE METABOLIC PANEL
ALT: 22 IU/L (ref 0–44)
AST: 22 IU/L (ref 0–40)
Albumin/Globulin Ratio: 2.2 (ref 1.2–2.2)
Albumin: 4.8 g/dL (ref 3.8–4.9)
Alkaline Phosphatase: 96 IU/L (ref 44–121)
BUN/Creatinine Ratio: 13 (ref 10–24)
BUN: 11 mg/dL (ref 8–27)
Bilirubin Total: 0.4 mg/dL (ref 0.0–1.2)
CO2: 23 mmol/L (ref 20–29)
Calcium: 9.7 mg/dL (ref 8.6–10.2)
Chloride: 97 mmol/L (ref 96–106)
Creatinine, Ser: 0.83 mg/dL (ref 0.76–1.27)
Globulin, Total: 2.2 g/dL (ref 1.5–4.5)
Glucose: 119 mg/dL — ABNORMAL HIGH (ref 65–99)
Potassium: 4.5 mmol/L (ref 3.5–5.2)
Sodium: 135 mmol/L (ref 134–144)
Total Protein: 7 g/dL (ref 6.0–8.5)
eGFR: 100 mL/min/{1.73_m2} (ref >59)

## 2020-11-07 LAB — LIPID PANEL
Chol/HDL Ratio: 3.1 ratio (ref 0.0–5.0)
Cholesterol, Total: 165 mg/dL (ref 100–199)
HDL: 54 mg/dL (ref >39)
LDL Chol Calc (NIH): 89 mg/dL (ref 0–99)
Triglycerides: 126 mg/dL (ref 0–149)
VLDL Cholesterol Cal: 22 mg/dL (ref 5–40)

## 2020-11-07 LAB — CBC
Hematocrit: 44 % (ref 37.5–51.0)
Hemoglobin: 14.9 g/dL (ref 13.0–17.7)
MCH: 30.1 pg (ref 26.6–33.0)
MCHC: 33.9 g/dL (ref 31.5–35.7)
MCV: 89 fL (ref 79–97)
Platelets: 342 10*3/uL (ref 150–450)
RBC: 4.95 x10E6/uL (ref 4.14–5.80)
RDW: 12.4 % (ref 11.6–15.4)
WBC: 9.1 10*3/uL (ref 3.4–10.8)

## 2020-11-07 LAB — PRO B NATRIURETIC PEPTIDE: NT-Pro BNP: 416 pg/mL — ABNORMAL HIGH (ref 0–210)

## 2020-11-07 LAB — TSH: TSH: 4.12 u[IU]/mL (ref 0.450–4.500)

## 2020-11-07 NOTE — Telephone Encounter (Signed)
Malachy Mood from [facility] have critical results for this pt

## 2020-11-07 NOTE — Telephone Encounter (Signed)
  8 mm nodular opacities overlying the right mid and lower lung zones. Follow-up chest CT is recommended for further characterization.

## 2020-11-08 MED ORDER — FUROSEMIDE 20 MG PO TABS: 20.0000 mg | ORAL_TABLET | Freq: Every day | ORAL | 3 refills | Status: DC

## 2020-11-08 MED ORDER — EZETIMIBE 10 MG PO TABS: 10.0000 mg | ORAL_TABLET | Freq: Every day | ORAL | 3 refills | Status: DC

## 2020-11-08 NOTE — Telephone Encounter (Signed)
Patient was returning call 

## 2020-11-08 NOTE — Addendum Note (Signed)
Addended by: Stephani Police on: 11/08/2020 02:18 PM   Modules accepted: Orders

## 2020-11-08 NOTE — Telephone Encounter (Signed)
See result note.  

## 2020-11-08 NOTE — Telephone Encounter (Signed)
Spoke with the pt and he verbalized understanding of his labwork, CXR and agrees to the changes.. he will call back if he develops any further questions.   See Lab and Imaging results.

## 2020-11-09 ENCOUNTER — Telehealth: Payer: Self-pay | Admitting: *Deleted

## 2020-11-09 NOTE — Telephone Encounter (Signed)
Prior Authorization for split night sent to [facility] via Phone. Per representative no PA is required. Call  Reference # [id number].

## 2020-11-09 NOTE — Telephone Encounter (Signed)
-----   Message from Jennifer Witty, RMA sent at 11/06/2020 10:29 AM EDT ----- Pt needs a split night sleep study  

## 2020-11-13 ENCOUNTER — Other Ambulatory Visit: Payer: Self-pay | Admitting: Cardiovascular Disease

## 2020-11-16 ENCOUNTER — Other Ambulatory Visit: Payer: Self-pay

## 2020-11-16 ENCOUNTER — Other Ambulatory Visit: Payer: BC Managed Care – PPO | Admitting: *Deleted

## 2020-11-16 DIAGNOSIS — E785 Hyperlipidemia, unspecified: Secondary | ICD-10-CM

## 2020-11-16 DIAGNOSIS — Z79899 Other long term (current) drug therapy: Secondary | ICD-10-CM

## 2020-11-16 DIAGNOSIS — I255 Ischemic cardiomyopathy: Secondary | ICD-10-CM

## 2020-11-16 DIAGNOSIS — R911 Solitary pulmonary nodule: Secondary | ICD-10-CM

## 2020-11-16 DIAGNOSIS — R0602 Shortness of breath: Secondary | ICD-10-CM

## 2020-11-16 DIAGNOSIS — I2510 Atherosclerotic heart disease of native coronary artery without angina pectoris: Secondary | ICD-10-CM

## 2020-11-16 NOTE — Telephone Encounter (Signed)
Patient is scheduled for lab study on 01-22-21. Patient understands his sleep study will be done at [facility] sleep lab. Patient understands he will receive a sleep packet in a week or so. Patient understands to call if he does not receive the sleep packet in a timely manner.  Left detailed message on voicemail with date and time of titration and informed patient to call back to confirm or reschedule.

## 2020-11-17 ENCOUNTER — Ambulatory Visit
Admission: RE | Admit: 2020-11-17 | Discharge: 2020-11-17 | Disposition: A | Discharge status: Home / Self Care | Payer: BC Managed Care – PPO | Source: Ambulatory Visit | Attending: Physician Assistant | Admitting: Physician Assistant

## 2020-11-17 DIAGNOSIS — R911 Solitary pulmonary nodule: Secondary | ICD-10-CM

## 2020-11-17 LAB — BASIC METABOLIC PANEL
BUN/Creatinine Ratio: 15 (ref 10–24)
BUN: 14 mg/dL (ref 8–27)
CO2: 23 mmol/L (ref 20–29)
Calcium: 9.2 mg/dL (ref 8.6–10.2)
Chloride: 98 mmol/L (ref 96–106)
Creatinine, Ser: 0.93 mg/dL (ref 0.76–1.27)
Glucose: 110 mg/dL — ABNORMAL HIGH (ref 65–99)
Potassium: 4.2 mmol/L (ref 3.5–5.2)
Sodium: 137 mmol/L (ref 134–144)
eGFR: 94 mL/min/{1.73_m2} (ref >59)

## 2020-11-20 ENCOUNTER — Telehealth: Payer: Self-pay | Admitting: Physician Assistant

## 2020-11-20 NOTE — Telephone Encounter (Signed)
Spoke with pt and advised per Melina Copa, PA  Please let pt know there remains 2 small pulmonary nodules that do not need any immediate action but the radiologist is recommending to continue to follow in the short term. They recommend a f/u CT scan in 3-6 months and if they are stable at time of repeat CT scan, then future CT in 18-24 months would be suggested to document that these are stable. There is also fluid in the esophagus that may be related to acid reflux or problems with esophageal motility/movement.  I would recommend he schedule an appt with his primary care provider to further follow these findings.  I would suggest he check his BP over the next 3 days about 3 hr after taking AM medicines and calling us with those readings so we can advise on next steps.  Pt reports he feels his SOB is somewhat improved with Lasix but is uncertain re: weight loss. Pt verbalizes understanding and agrees with current plan.

## 2020-11-20 NOTE — Telephone Encounter (Signed)
° °  Pt is returning call to get lab result °

## 2020-12-06 ENCOUNTER — Ambulatory Visit (HOSPITAL_COMMUNITY): Payer: BC Managed Care – PPO | Attending: Cardiology

## 2020-12-06 ENCOUNTER — Other Ambulatory Visit: Payer: Self-pay

## 2020-12-06 DIAGNOSIS — R0602 Shortness of breath: Secondary | ICD-10-CM | POA: Insufficient documentation

## 2020-12-06 DIAGNOSIS — I5042 Chronic combined systolic (congestive) and diastolic (congestive) heart failure: Secondary | ICD-10-CM | POA: Insufficient documentation

## 2020-12-06 LAB — ECHOCARDIOGRAM COMPLETE
Area-P 1/2: 3.37 cm2
S' Lateral: 3.8 cm

## 2020-12-06 MED ORDER — PERFLUTREN LIPID MICROSPHERE
1.0000 mL | INTRAVENOUS | Status: AC | PRN
  Administered 2020-12-06: 1 mL via INTRAVENOUS

## 2020-12-16 ENCOUNTER — Other Ambulatory Visit: Payer: Self-pay | Admitting: Cardiovascular Disease

## 2020-12-18 ENCOUNTER — Encounter: Payer: Self-pay | Admitting: Physician Assistant

## 2020-12-18 NOTE — Progress Notes (Addendum)
Cardiology Office Note    Date:  12/19/2020   ID:  Luis Mitchell, DOB 11-12-1960, MRN [id number]  PCP:  Luis Graff.Marlou Sa, MD  Cardiologist:  Lauree Chandler, MD  Electrophysiologist:  None   Chief Complaint: f/u CHF  History of Present Illness:   Luis Mitchell is a 60 y.o. male with history of CAD (MI s/p BMS to mRCA 2007 with residual 90% diagonal with 70-80% subbranch, 50% Cx disease, 30% RCA), chronic cardiomyopathy with EF 45-50% in 08/2014, mildly dilated ascending aorta, HTN, HLD, ETOH use who presents for f/u of SOB. No cath since PCI in 2007 at which time echo showed EF 50-55%. Nuc in 08/2014 showed low risk; prior MI, no ischemia, EF 37%. 2D echo 08/2014 showed EF 45-50%, grade 1 DD, mildly dilated ascending aorta. Most recent echo prior to recent OV from 04/2017 showed mild LVH, EF 40-45%, hypokinesis of the inferior myocardium, mildly dilated aortic root, moderately dilated LA, mildly dilated RV/RA. He was started on ARB at that time later changed to ACEi due to the ARB recalls at the time. I recently met him in follow-up 10/2020 at which time he was reporting increased SOB, DOE and orthopnea ever since Covid infection 7 months prior. He did receive the antibody infusion but was unvaccinated. BNP was elevated so started on Lasix with intention to switch ACEi to [name] but did not hear back from the patient regarding his BP readings as requested. Echo 12/06/20 showed EF 45-50%, inferior/inferoseptal hypokinesis, mild LVH, grade 1 DD, normal RV, mild dilation of aortic root, overall stable. Sleep study was ordered given patient-reported witnessed apnea while sleeping, pending for July. CXR incidentally noted pulmonary nodules so f/u CT was performed showing:  1. 7 mm posterior right upper lobe pulmonary nodule with 5 mm left lower lobe perifissural nodule. Non-contrast chest CT at 3-6 months is recommended. If the nodules are stable at time of repeat CT, then future CT at 18-24  months (from today's scan) is considered optional for low-risk patients, but is recommended for high-risk patients. This recommendation follows the consensus statement: Guidelines for Management of Incidental Pulmonary Nodules Detected on CT Images: From the Fleischner Society 2017; Radiology 2017; 284:228-243. 2. Fluid in the esophagus may be related to dysmotility or reflux. 3. Aortic Atherosclerosis (ICD10-I70.0). Follow up with primary care was encouraged.   He is seen back for follow-up feeling about the same. He did see mild improvement with the Lasix with regard to his DOE, but he continues to have nightly orthopnea. Weight up 2lb. No chest pain, palpitations, dizziness, syncope. Initial BP 116/80 by MA, recheck 142/80 in R arm and 132/80 in L arm by me. He sees readings of 140s-150s/80s-90s in the AM when he first wakes up, followed by readings in the 1teens-130s later in the day.  Labwork independently reviewed: 10/2020 TSH wnl CBC wnl, BNP 416, LDL 89, CMET ok except glucose 119, f/u BMET OK Cr 0.93 K 4.2 08/2019 LFTs wnl, LDL 74, K 4.6, Cr 0.92 2018 CBC wnl   Past Medical History:  Diagnosis Date   Chronic combined systolic (congestive) and diastolic (congestive) heart failure (HCC)    Coronary atherosclerosis of native coronary artery    a. MI s/p BMS to [facility] 2007 with residual 90% diagonal, 50% Cx disease. b. stress test 08/2014 without ischemia.   Dilated aortic root (HCC)    HTN (hypertension)    Hyperlipidemia    Ischemic cardiomyopathy    Myocardial infarction ([place]) 06/20/2006  Obesity    Pulmonary nodules     Past Surgical History:  Procedure Laterality Date   CORONARY STENT PLACEMENT     None      Current Medications: Current Meds  Medication Sig   amLODipine (NORVASC) 10 MG tablet Take 1 tablet by mouth once daily   cetirizine (ZYRTEC) 10 MG tablet Take 1 tablet (10 mg total) by mouth daily.   clopidogrel (PLAVIX) 75 MG tablet Take 1 tablet (75 mg total) by  mouth daily.   diphenhydrAMINE (BENADRYL) 25 MG tablet Take 1 tab as needed for allergic reactions   ezetimibe (ZETIA) 10 MG tablet Take 1 tablet (10 mg total) by mouth daily. Start a few days after starting the Lasix/ Furosemide   furosemide (LASIX) 20 MG tablet Take 1 tablet (20 mg total) by mouth daily. Take in the morning.   ipratropium (ATROVENT) 0.03 % nasal spray Place 2 sprays into both nostrils 2 (two) times daily.   lisinopril (ZESTRIL) 40 MG tablet Take 1 tablet by mouth once daily   metoprolol succinate (TOPROL-XL) 100 MG 24 hr tablet TAKE 1 TABLET BY MOUTH ONCE DAILY . APPOINTMENT REQUIRED FOR FUTURE REFILLS .  CALL  [phone]   mometasone (NASONEX) 50 MCG/ACT nasal spray USE TWO SPRAY(S) IN EACH NOSTRIL ONCE DAILY   rosuvastatin (CRESTOR) 40 MG tablet Take 1 tablet by mouth once daily      Allergies:   Aspirin, Nsaids, and Tolmetin   Social History   Socioeconomic History   Marital status: Married    Spouse name: Not on file   Number of children: 2   Years of education: Not on file   Highest education level: Not on file  Occupational History   Occupation: Carpenter  Tobacco Use   Smoking status: Former    Pack years: 0.00    Types: Cigars   Smokeless tobacco: Never  Vaping Use   Vaping Use: Never used  Substance and Sexual Activity   Alcohol use: Yes    Alcohol/week: 42.0 standard drinks    Types: 42 Standard drinks or equivalent per week    Comment: Drinks 4 per day   Drug use: No   Sexual activity: Not on file  Other Topics Concern   Not on file  Social History Narrative   Not on file   Social Determinants of Health   Financial Resource Strain: Not on file  Food Insecurity: Not on file  Transportation Needs: Not on file  Physical Activity: Not on file  Stress: Not on file  Social Connections: Not on file     Family History:  The patient's family history includes Heart attack in his father. There is no history of Colon cancer, Colon polyps,  Esophageal cancer, or Kidney disease.  ROS:   Please see the history of present illness.  All other systems are reviewed and otherwise negative.    EKGs/Labs/Other Studies Reviewed:    Studies reviewed are outlined and summarized above. Reports included below if pertinent.  2D echo 12/06/20   1. Left ventricular ejection fraction, by estimation, is 45 to 50%. The  left ventricle has mildly decreased function. The left ventricle  demonstrates regional wall motion abnormalities (see scoring  diagram/findings for description).  Inferior/inferoseptal hypokinesis. There is mild left ventricular  hypertrophy. Left ventricular diastolic parameters are consistent with  Grade I diastolic dysfunction (impaired relaxation).   2. Right ventricular systolic function is normal. The right ventricular  size is normal. Tricuspid regurgitation signal is inadequate for assessing  PA pressure.   3. The mitral valve is normal in structure. No evidence of mitral valve  regurgitation. No evidence of mitral stenosis.   4. The aortic valve is tricuspid. Aortic valve regurgitation is not  visualized. No aortic stenosis is present.   5. Aortic dilatation noted. There is mild dilatation of the aortic root,  measuring 40 mm.   6. The inferior vena cava is normal in size with greater than 50%  respiratory variability, suggesting right atrial pressure of 3 mmHg.   Comparison(s): 05/12/17 EF 40-45%.   2D echo 04/2017 - Left ventricle: The cavity size was normal. Wall thickness was    increased in a pattern of mild LVH. Systolic function was mildly    to moderately reduced. The estimated ejection fraction was in the    range of 40% to 45%. There is hypokinesis of the inferior    myocardium. Features are consistent with a pseudonormal left    ventricular filling pattern, with concomitant abnormal relaxation    and increased filling pressure (grade 2 diastolic dysfunction).  - Aortic root: The aortic root was  mildly dilated.  - Left atrium: The atrium was moderately dilated.  - Right ventricle: The cavity size was mildly dilated.  - Right atrium: The atrium was mildly dilated.   Impressions:   - Hypokinesis of the inferior basal wall with overall mild to    moderate LV dysfunction; moderate diastolic dysfunction; mildly    dilated aortic root; moderate LAE; mild RAE and RVE.     EKG:  EKG is not ordered today  Recent Labs: 11/06/2020: ALT 22; Hemoglobin 14.9; NT-Pro BNP 416; Platelets 342; TSH 4.120 11/16/2020: BUN 14; Creatinine, Ser 0.93; Potassium 4.2; Sodium 137  Recent Lipid Panel    Component Value Date/Time   CHOL 165 11/06/2020 0952   TRIG 126 11/06/2020 0952   HDL 54 11/06/2020 0952   CHOLHDL 3.1 11/06/2020 0952   CHOLHDL 2.8 06/09/2016 0859   VLDL 15 06/09/2016 0859   LDLCALC 89 11/06/2020 0952   LDLDIRECT 152.7 10/24/2009 0000    PHYSICAL EXAM:    VS:  BP 116/80 (BP Location: Left Arm, Patient Position: Sitting, Cuff Size: Normal)   Pulse 73   Ht 5' 7.5" (1.715 m)   Wt 279 lb (126.6 kg)   SpO2 98%   BMI 43.05 kg/m   BMI: Body mass index is 43.05 kg/m.  GEN: Well nourished, well developed male in no acute distress HEENT: normocephalic, atraumatic Neck: no JVD, carotid bruits, or masses Cardiac: RRR; no murmurs, rubs, or gallops, no edema  Respiratory:  mildly diminished/coarse BS throughout similar to prior, no wheezing or rales, normal work of breathing GI: soft, nontender, nondistended, + BS MS: no deformity or atrophy Skin: warm and dry, no rash Neuro:  Alert and Oriented x 3, Strength and sensation are intact, follows commands Psych: euthymic mood, full affect  Wt Readings from Last 3 Encounters:  12/19/20 279 lb (126.6 kg)  11/06/20 277 lb (125.6 kg)  08/08/19 285 lb 1.9 oz (129.3 kg)     ASSESSMENT & PLAN:   1. SOB - he saw mild improvement with Lasix in DOE. Predominant symptom is still orthopnea - states "I feel like I'm smothered at night" and  has to prop up. No SOB at rest upright. No angina. Weight up 2 additional pounds. 2D echo showed general stability of LV function compared to previous. Will continue to titrate HF regimen for now. Increase Lasix to 2 tablets (25m) daily  for now, duration TBD based on labs. D/c lisinopril and start Entresto 49/101m BID on Friday to allow washout. His BP ranges from 1-teens to 150s so tentatively will decrease amlodipine to 541mdaily to accomodate his heart failure regimen changes. He will continue to keep a log of his BPs. Will check BMET today and BMET in 1 week, with close follow-up in 1 month. When we call him with labwork in 1 week, will check in with him how BPs are running. I also reviewed his CT scan results with him and recommended he follow up with his primary care to discuss timing of next CT scan for his pulmonary nodules as well as the esophageal findings on the study. He verbalized understanding. He is not tachycardic, tachypneic or hypoxic. Ultimately, if dyspnea does not resolve or worsens, would need to consider [facility].   2. Chronic combined CHF - NYHA class II-III with orthopnea. Echo as reviewed above. Will titrate medications as outlined. Also have sleep study planned for July for suspected sleep apnea.  3. CAD in native artery with HLD goal LDL <70 - stable, also see assessment above, no angina. He is maintained on Plavix monotherapy, BB, statin. Zetia added per last labs. Has recheck labwork end of July to reassess lipids.   4. Essential HTN - follow BP with med changes above.   5. Dilated aortic root - anticipate annual follow-up. Timing of repeat echo can be decided in follow-up, would be due 11/2021.  Disposition: F/u with Dr. McAngelena Formr APP in 1 month.   Medication Adjustments/Labs and Tests Ordered: Current medicines are reviewed at length with the patient today.  Concerns regarding medicines are outlined above. Medication changes, Labs and  Tests ordered today are summarized above and listed in the Patient Instructions accessible in Encounters.   Signed, DaCharlie PitterPA-C  12/19/2020 11:42 AM    [place]  [id number]: [phone]: [phone]

## 2020-12-19 ENCOUNTER — Ambulatory Visit (INDEPENDENT_AMBULATORY_CARE_PROVIDER_SITE_OTHER): Payer: BC Managed Care – PPO | Admitting: Physician Assistant

## 2020-12-19 ENCOUNTER — Encounter: Payer: Self-pay | Admitting: Physician Assistant

## 2020-12-19 ENCOUNTER — Other Ambulatory Visit: Payer: Self-pay

## 2020-12-19 VITALS — BP 116/80 | HR 73 | Ht 67.5 in | Wt 279.0 lb

## 2020-12-19 DIAGNOSIS — E785 Hyperlipidemia, unspecified: Secondary | ICD-10-CM

## 2020-12-19 DIAGNOSIS — I5042 Chronic combined systolic (congestive) and diastolic (congestive) heart failure: Secondary | ICD-10-CM | POA: Diagnosis not present

## 2020-12-19 DIAGNOSIS — R0602 Shortness of breath: Secondary | ICD-10-CM | POA: Diagnosis not present

## 2020-12-19 DIAGNOSIS — I2510 Atherosclerotic heart disease of native coronary artery without angina pectoris: Secondary | ICD-10-CM | POA: Diagnosis not present

## 2020-12-19 DIAGNOSIS — I10 Essential (primary) hypertension: Secondary | ICD-10-CM

## 2020-12-19 DIAGNOSIS — I77810 Thoracic aortic ectasia: Secondary | ICD-10-CM

## 2020-12-19 LAB — BASIC METABOLIC PANEL
BUN/Creatinine Ratio: 18 (ref 10–24)
BUN: 20 mg/dL (ref 8–27)
CO2: 23 mmol/L (ref 20–29)
Calcium: 9.5 mg/dL (ref 8.6–10.2)
Chloride: 95 mmol/L — ABNORMAL LOW (ref 96–106)
Creatinine, Ser: 1.13 mg/dL (ref 0.76–1.27)
Glucose: 107 mg/dL — ABNORMAL HIGH (ref 65–99)
Potassium: 4.4 mmol/L (ref 3.5–5.2)
Sodium: 136 mmol/L (ref 134–144)
eGFR: 74 mL/min/{1.73_m2} (ref >59)

## 2020-12-19 MED ORDER — AMLODIPINE BESYLATE 5 MG PO TABS: 5.0000 mg | ORAL_TABLET | Freq: Every day | ORAL | 3 refills | Status: DC

## 2020-12-19 MED ORDER — ENTRESTO 49-51 MG PO TABS: 1.0000 | ORAL_TABLET | Freq: Two times a day (BID) | ORAL | 0 refills | Status: DC

## 2020-12-19 MED ORDER — FUROSEMIDE 20 MG PO TABS: 40.0000 mg | ORAL_TABLET | Freq: Every day | ORAL | 11 refills | Status: DC

## 2020-12-19 NOTE — Patient Instructions (Addendum)
Medication Instructions:  Your physician has recommended you make the following change in your medication:   STOP Lisinopril   INCREASE the Lasix to 2 tablets daily  REDUCE the Amlodipine to 5 mg daily.  You may take 1/2 of the 10 mg tablets if you are able to cut them in 1/2.  We did send in a new prescription for the 5 mg tablets. START Entresto 49/51 mg taking 1 tablet twice a day DO NOT START UNTIL Friday June 24TH  Please Honor Card patient is presenting for Carmie Kanner: [id number]; RXGRP: [id number]; QJFHL: [id number]; KTGY: [id number]       *If you need a refill on your cardiac medications before your next appointment, please call your pharmacy*   Lab Work: TODAY:  BMET  1 WEEK:  BMET  If you have labs (blood work) drawn today and your tests are completely normal, you will receive your results only by: [place] (if you have MyChart) OR A paper copy in the mail If you have any lab test that is abnormal or we need to change your treatment, we will call you to review the results.   Testing/Procedures: None ordered   Follow-Up: At [facility], you and your health needs are our priority.  As part of our continuing mission to provide you with exceptional heart care, we have created designated Provider Care Teams.  These Care Teams include your primary Cardiologist (physician) and Advanced Practice Providers (APPs -  Physician Assistants and Nurse Practitioners) who all work together to provide you with the care you need, when you need it.  We recommend signing up for the patient portal called "MyChart".  Sign up information is provided on this After Visit Summary.  MyChart is used to connect with patients for Virtual Visits (Telemedicine).  Patients are able to view lab/test results, encounter notes, upcoming appointments, etc.  Non-urgent messages can be sent to your provider as well.   To learn more about what you can do with MyChart, go to [email or web address].    Your  next appointment:   1 month(s)  The format for your next appointment:   In Person  Provider:   You may see Lauree Chandler, MD or one of the following Advanced Practice Providers on your designated Care Team:   Melina Copa, [place]    Other Instructions

## 2020-12-20 ENCOUNTER — Telehealth: Payer: Self-pay | Admitting: *Deleted

## 2020-12-20 DIAGNOSIS — Z79899 Other long term (current) drug therapy: Secondary | ICD-10-CM

## 2020-12-20 DIAGNOSIS — I2510 Atherosclerotic heart disease of native coronary artery without angina pectoris: Secondary | ICD-10-CM

## 2020-12-20 DIAGNOSIS — I10 Essential (primary) hypertension: Secondary | ICD-10-CM

## 2020-12-20 DIAGNOSIS — R0602 Shortness of breath: Secondary | ICD-10-CM

## 2020-12-20 DIAGNOSIS — Z0189 Encounter for other specified special examinations: Secondary | ICD-10-CM

## 2020-12-20 NOTE — Telephone Encounter (Signed)
Pt aware of lab results and recommendations per Dayna Dunn PA-C.  Pt aware to come in for repeat BMET in one week, here at the office.  Scheduled the pts lab appt for 6/30 to recheck BMET. Pt verbalized understanding and agrees with this plan.

## 2020-12-20 NOTE — Telephone Encounter (Signed)
-----   Message from Charlie Pitter, [place] sent at 12/20/2020 10:45 AM EDT ----- Please let patient know BMET relatively stable. Will continue to watch kidney function trend with med changes as discussed. Needs BMET 1 week. This was outlined on AVS but do not see scheduled, please make sure this is arranged. Thanks!

## 2020-12-27 ENCOUNTER — Other Ambulatory Visit: Payer: Self-pay

## 2020-12-27 ENCOUNTER — Other Ambulatory Visit: Payer: BC Managed Care – PPO

## 2020-12-27 DIAGNOSIS — Z0189 Encounter for other specified special examinations: Secondary | ICD-10-CM

## 2020-12-27 DIAGNOSIS — R0602 Shortness of breath: Secondary | ICD-10-CM

## 2020-12-27 DIAGNOSIS — Z79899 Other long term (current) drug therapy: Secondary | ICD-10-CM

## 2020-12-27 DIAGNOSIS — I10 Essential (primary) hypertension: Secondary | ICD-10-CM

## 2020-12-27 DIAGNOSIS — I2510 Atherosclerotic heart disease of native coronary artery without angina pectoris: Secondary | ICD-10-CM

## 2020-12-27 LAB — BASIC METABOLIC PANEL
BUN/Creatinine Ratio: 17 (ref 10–24)
BUN: 15 mg/dL (ref 8–27)
CO2: 23 mmol/L (ref 20–29)
Calcium: 9.4 mg/dL (ref 8.6–10.2)
Chloride: 96 mmol/L (ref 96–106)
Creatinine, Ser: 0.89 mg/dL (ref 0.76–1.27)
Glucose: 118 mg/dL — ABNORMAL HIGH (ref 65–99)
Potassium: 4.3 mmol/L (ref 3.5–5.2)
Sodium: 134 mmol/L (ref 134–144)
eGFR: 98 mL/min/{1.73_m2} (ref >59)

## 2020-12-28 ENCOUNTER — Telehealth: Payer: Self-pay | Admitting: Cardiovascular Disease

## 2020-12-28 NOTE — Telephone Encounter (Signed)
Returned call to pt and he has been made aware of his lab results.

## 2020-12-28 NOTE — Telephone Encounter (Signed)
Pt is returning call in regards to his most recent Lab results. Please advise pt further

## 2020-12-28 NOTE — Telephone Encounter (Signed)
-----   Message from Charlie Pitter, [place] sent at 12/28/2020 11:45 AM EDT ----- Please let pt know labs look totally stable (except mildly elevated blood sugar). Kidney function is actually now improved with updated regimen. Please find out how blood pressures are running along with any update in shortness of breath and any weight changes with the adjustments.

## 2021-01-22 ENCOUNTER — Encounter (HOSPITAL_BASED_OUTPATIENT_CLINIC_OR_DEPARTMENT_OTHER): Payer: BC Managed Care – PPO | Admitting: Cardiology

## 2021-01-23 ENCOUNTER — Other Ambulatory Visit: Payer: Self-pay

## 2021-01-23 ENCOUNTER — Other Ambulatory Visit: Payer: BC Managed Care – PPO

## 2021-01-23 ENCOUNTER — Other Ambulatory Visit: Payer: Self-pay | Admitting: Physician Assistant

## 2021-01-23 DIAGNOSIS — I255 Ischemic cardiomyopathy: Secondary | ICD-10-CM

## 2021-01-23 DIAGNOSIS — I2510 Atherosclerotic heart disease of native coronary artery without angina pectoris: Secondary | ICD-10-CM

## 2021-01-23 DIAGNOSIS — E785 Hyperlipidemia, unspecified: Secondary | ICD-10-CM

## 2021-01-23 DIAGNOSIS — R911 Solitary pulmonary nodule: Secondary | ICD-10-CM

## 2021-01-23 DIAGNOSIS — Z79899 Other long term (current) drug therapy: Secondary | ICD-10-CM

## 2021-01-23 DIAGNOSIS — R0602 Shortness of breath: Secondary | ICD-10-CM

## 2021-01-23 LAB — COMPREHENSIVE METABOLIC PANEL
ALT: 23 IU/L (ref 0–44)
AST: 18 IU/L (ref 0–40)
Albumin/Globulin Ratio: 1.8 (ref 1.2–2.2)
Albumin: 4.5 g/dL (ref 3.8–4.9)
Alkaline Phosphatase: 83 IU/L (ref 44–121)
BUN/Creatinine Ratio: 21 (ref 10–24)
BUN: 19 mg/dL (ref 8–27)
Bilirubin Total: 0.4 mg/dL (ref 0.0–1.2)
CO2: 24 mmol/L (ref 20–29)
Calcium: 9.3 mg/dL (ref 8.6–10.2)
Chloride: 97 mmol/L (ref 96–106)
Creatinine, Ser: 0.92 mg/dL (ref 0.76–1.27)
Globulin, Total: 2.5 g/dL (ref 1.5–4.5)
Glucose: 111 mg/dL — ABNORMAL HIGH (ref 65–99)
Potassium: 4.1 mmol/L (ref 3.5–5.2)
Sodium: 137 mmol/L (ref 134–144)
Total Protein: 7 g/dL (ref 6.0–8.5)
eGFR: 95 mL/min/{1.73_m2} (ref >59)

## 2021-01-23 LAB — LIPID PANEL
Chol/HDL Ratio: 2.9 ratio (ref 0.0–5.0)
Cholesterol, Total: 134 mg/dL (ref 100–199)
HDL: 46 mg/dL (ref >39)
LDL Chol Calc (NIH): 69 mg/dL (ref 0–99)
Triglycerides: 103 mg/dL (ref 0–149)
VLDL Cholesterol Cal: 19 mg/dL (ref 5–40)

## 2021-01-24 ENCOUNTER — Ambulatory Visit: Payer: BC Managed Care – PPO | Admitting: Cardiovascular Disease

## 2021-01-25 ENCOUNTER — Telehealth: Payer: Self-pay

## 2021-01-25 NOTE — Telephone Encounter (Signed)
-----   Message from Luis Mitchell, [place] sent at 01/24/2021 12:12 PM EDT ----- Just next available OV is fine. Also please have patient relay how his BP is doing

## 2021-01-25 NOTE — Telephone Encounter (Signed)
Reviewed recommendations with pt. He states he is used to the SOB so it doesn't bother him. He is not sure if it has improved. He has not lost any weight.   He accepted a Sept follow up appointment.

## 2021-01-25 NOTE — Telephone Encounter (Signed)
Thank you for update. Please obtain the following information from patient:  1) How is the SOB he was experiencing, his chief complaint, I.e. the "Smothering sensation" any better? This is the primary issue we hoped to see improvement in.  2) Any changes in weight?  3) Thank you for the generalized BP trends. (It has been challenging getting consistent BP updates from him despite our request. This is the best way we can navigate adjusting his medications.) Please give him these specific instructions: please obtain BP over the next several days specifically 3 hours after taking AM meds - please relay results on Monday to help guide further med titration plans.   I am showing several appts available in Sept with me if we can schedule then. Keep f/u in 06/2021 with Dr. Angelena Form.  Armour Villanueva PA-C

## 2021-01-25 NOTE — Telephone Encounter (Signed)
Spoke with the pt and he reports that his BP is elevated some mornings prior to his meds.   This morning it was 150/100 but after meds it was 140/90... the afternoons it is always around 124/87.   He denies headache, dizziness, chest pain.   He checks his BP consistently but he does not have a record I have urged him to keep a record for [place]. He does not have My Chart but I told him we can communicate over the phone.   He has been taking his meds daily. His Luis Mitchell is a little expensive but his wife has been able to get it for him cheaper through the manufacturer.   His next OV with Luis Mitchell is 06/2021.... Luis Mitchell's next available is not until 05/2021 and he declines seeing any other provider he is reluctant to have too many people making changes.   I will forward to Luis/ Luis Mitchell for review and if any openings happen to occur on her schedule.

## 2021-01-25 NOTE — Telephone Encounter (Signed)
Left a message for the pt to call back.  

## 2021-03-11 NOTE — Progress Notes (Deleted)
Cardiology Office Note    Date:  03/11/2021   ID:  Luis Mitchell, DOB 05/17/1961, MRN [id number]  PCP:  Aurea Graff.Marlou Sa, MD  Cardiologist:  Lauree Chandler, MD  Electrophysiologist:  None   Chief Complaint: ***  History of Present Illness:   Luis Mitchell is a 60 y.o. male with history of CAD (MI s/p BMS to mRCA 2007 with residual 90% diagonal with 70-80% subbranch, 50% Cx disease, 30% RCA), chronic cardiomyopathy with EF 45-50%, mildly dilated ascending aorta, HTN, HLD, ETOH use, pulmonary nodules who presents for f/u of SOB.  He has not required cath since PCI in 2007 at which time echo showed EF 50-55%. Nuc in 08/2014 was low risk; prior MI, no ischemia, EF 37%, then closer to the 40-50% range since that time. I recently met him in follow-up 10/2020 at which time he was reporting increased SOB, DOE and orthopnea ever since Covid infection 7 months prior. He did receive the antibody infusion but was unvaccinated. BNP was elevated so started on Lasix with intention to switch ACEi to [facility] but did not hear back from the patient regarding his BP readings and symptoms as requested at the time. Echo 12/06/20 showed EF 45-50%, inferior/inferoseptal hypokinesis, mild LVH, grade 1 DD, normal RV, mild dilation of aortic root, overall stable. Sleep study was ordered for sleep-disordered breathing. A CXR incidentally noted pulmonary nodules so f/u CT was performed showing a 46m RUL nodule, 532mLUL nodule and potential fluid in esophagus for which we asked him to f/u primary care. He later followed up in 11/2020 and [facility] to determine at times whether he has felt better with these interventions.  Osa study?  Shortness of breath Chronic combined CHF CAD in native artery with HLD goal LDL <70 Essential HTN Dilated aortic root  Labwork independently reviewed: 12/2020 CMET ok except glu 111, LDL 69 10/2020 Hgb 14.9, BNP 416, TSH  wnl   Past Medical History:  Diagnosis Date   Chronic combined systolic (congestive) and diastolic (congestive) heart failure (HCC)    Coronary atherosclerosis of native coronary artery    [facility] residual 90% diagonal, 50% Cx disease. b. stress test 08/2014 without ischemia.   Dilated aortic root (HCC)    HTN (hypertension)    Hyperlipidemia    Ischemic cardiomyopathy    [place]   Obesity    Pulmonary nodules     Past Surgical History:  Procedure Laterality Date   CORONARY STENT PLACEMENT     None      Current Medications: No outpatient medications have been marked as taking for the 03/12/21 encounter (Appointment) with DuCharlie PitterPA-C.   ***   Allergies:   Aspirin, Nsaids, and Tolmetin   Social History   Socioeconomic History   Marital status: Married    Spouse name: Not on file   Number of children: 2   Years of education: Not on file   Highest education level: Not on file  Occupational History   Occupation: Carpenter  Tobacco Use   Smoking status: Former    Types: Cigars   Smokeless tobacco: Never  Vaping Use   Vaping Use: Never used  Substance and Sexual Activity   Alcohol use: Yes    Alcohol/week: 42.0 standard drinks    Types: 42 Standard drinks or equivalent per week    Comment: Drinks 4 per day   Drug  use: No   Sexual activity: Not on file  Other Topics Concern   Not on file  Social History Narrative   Not on file   Social Determinants of Health   Financial Resource Strain: Not on file  Food Insecurity: Not on file  Transportation Needs: Not on file  Physical Activity: Not on file  Stress: Not on file  Social Connections: Not on file     Family History:  The patient's ***family history includes Heart attack in his father. There is no history of Colon cancer, Colon polyps, Esophageal cancer, or Kidney disease.  ROS:   Please see the history of present illness. Otherwise, review of  systems is positive for ***.  All other systems are reviewed and otherwise negative.    EKGs/Labs/Other Studies Reviewed:    Studies reviewed are outlined and summarized above. Reports included below if pertinent.  2D echo 12/06/20   1. Left ventricular ejection fraction, by estimation, is 45 to 50%. The  left ventricle has mildly decreased function. The left ventricle  demonstrates regional wall motion abnormalities (see scoring  diagram/findings for description).  Inferior/inferoseptal hypokinesis. There is mild left ventricular  hypertrophy. Left ventricular diastolic parameters are consistent with  Grade I diastolic dysfunction (impaired relaxation).   2. Right ventricular systolic function is normal. The right ventricular  size is normal. Tricuspid regurgitation signal is inadequate for assessing  PA pressure.   3. The mitral valve is normal in structure. No evidence of mitral valve  regurgitation. No evidence of mitral stenosis.   4. The aortic valve is tricuspid. Aortic valve regurgitation is not  visualized. No aortic stenosis is present.   5. Aortic dilatation noted. There is mild dilatation of the aortic root,  measuring 40 mm.   6. The inferior vena cava is normal in size with greater than 50%  respiratory variability, suggesting right atrial pressure of 3 mmHg.   Comparison(s): 05/12/17 EF 40-45%.    2D echo 04/2017 - Left ventricle: The cavity size was normal. Wall thickness was    increased in a pattern of mild LVH. Systolic function was mildly    to moderately reduced. The estimated ejection fraction was in the    range of 40% to 45%. There is hypokinesis of the inferior    myocardium. Features are consistent with a pseudonormal left    ventricular filling pattern, with concomitant abnormal relaxation    and increased filling pressure (grade 2 diastolic dysfunction).  - Aortic root: The aortic root was mildly dilated.  - Left atrium: The atrium was moderately  dilated.  - Right ventricle: The cavity size was mildly dilated.  - Right atrium: The atrium was mildly dilated.   Impressions:   - Hypokinesis of the inferior basal wall with overall mild to    moderate LV dysfunction; moderate diastolic dysfunction; mildly    dilated aortic root; moderate LAE; mild RAE and RVE.     EKG:  EKG is ordered today, personally reviewed, demonstrating ***  Recent Labs: 11/06/2020: Hemoglobin 14.9; NT-Pro BNP 416; Platelets 342; TSH 4.120 01/23/2021: ALT 23; BUN 19; Creatinine, Ser 0.92; Potassium 4.1; Sodium 137  Recent Lipid Panel    Component Value Date/Time   CHOL 134 01/23/2021 0808   TRIG 103 01/23/2021 0808   HDL 46 01/23/2021 0808   CHOLHDL 2.9 01/23/2021 0808   CHOLHDL 2.8 06/09/2016 0859   VLDL 15 06/09/2016 0859   LDLCALC 69 01/23/2021 0808   LDLDIRECT 152.7 10/24/2009 0000    PHYSICAL  EXAM:    VS:  There were no vitals taken for this visit.  BMI: There is no height or weight on file to calculate BMI.  GEN: Well nourished, well developed male in no acute distress HEENT: normocephalic, atraumatic Neck: no JVD, carotid bruits, or masses Cardiac: ***RRR; no murmurs, rubs, or gallops, no edema  Respiratory:  clear to auscultation bilaterally, normal work of breathing GI: soft, nontender, nondistended, + BS MS: no deformity or atrophy Skin: warm and dry, no rash Neuro:  Alert and Oriented x 3, Strength and sensation are intact, follows commands Psych: euthymic mood, full affect  Wt Readings from Last 3 Encounters:  12/19/20 279 lb (126.6 kg)  11/06/20 277 lb (125.6 kg)  08/08/19 285 lb 1.9 oz (129.3 kg)     ASSESSMENT & PLAN:   ***     Disposition: F/u with ***   Medication Adjustments/Labs and Tests Ordered: Current medicines are reviewed at length with the patient today.  Concerns regarding medicines are outlined above. Medication changes, Labs and Tests ordered today are summarized above and listed in the Patient  Instructions accessible in Encounters.   Signed, Charlie Pitter, PA-C  03/11/2021 11:45 AM    [facility] [address], [place], [place]  [address] Phone: [phone]; Fax: [phone]

## 2021-03-12 ENCOUNTER — Ambulatory Visit: Payer: BC Managed Care – PPO | Admitting: Physician Assistant

## 2021-03-12 ENCOUNTER — Other Ambulatory Visit: Payer: Self-pay | Admitting: Cardiovascular Disease

## 2021-03-12 DIAGNOSIS — I5042 Chronic combined systolic (congestive) and diastolic (congestive) heart failure: Secondary | ICD-10-CM

## 2021-03-12 DIAGNOSIS — R0602 Shortness of breath: Secondary | ICD-10-CM

## 2021-03-12 DIAGNOSIS — I10 Essential (primary) hypertension: Secondary | ICD-10-CM

## 2021-03-12 DIAGNOSIS — I77810 Thoracic aortic ectasia: Secondary | ICD-10-CM

## 2021-03-12 DIAGNOSIS — E785 Hyperlipidemia, unspecified: Secondary | ICD-10-CM

## 2021-03-12 DIAGNOSIS — I2510 Atherosclerotic heart disease of native coronary artery without angina pectoris: Secondary | ICD-10-CM

## 2021-03-22 ENCOUNTER — Other Ambulatory Visit: Payer: Self-pay | Admitting: Physician Assistant

## 2021-03-31 ENCOUNTER — Encounter (HOSPITAL_BASED_OUTPATIENT_CLINIC_OR_DEPARTMENT_OTHER): Payer: BC Managed Care – PPO | Admitting: Cardiology

## 2021-04-30 NOTE — Progress Notes (Signed)
Cardiology Office Note    Date:  05/07/2021   ID:  Luis Mitchell, DOB 1961/05/07, MRN [id number]   PCP:  Alroy Dust, Carlean Jews.Marlou Sa, [facility]  Cardiologist:  Lauree Chandler, MD   Advanced Practice Provider:  No care team member to display Electrophysiologist:  None   [phone]   Chief Complaint  Patient presents with   Follow-up    History of Present Illness:  Luis Mitchell is a 60 y.o. male  with history of CAD (MI s/p BMS to mRCA 2007 with residual 90% diagonal with 70-80% subbranch, 50% Cx disease, 30% RCA), chronic cardiomyopathy with EF 45-50% in 08/2014, mildly dilated ascending aorta, HTN, HLD, ETOH use   Patient last seen in the office 12/19/2020 with dyspnea on exertion  mild improvement with Lasix.  He continued to complain of mostly orthopnea.  Weight was up 2 pounds.  Lasix increased to 40 mg daily.  Lisinopril stopped and he was started on Entresto 49/51 mg twice daily amlodipine decreased to 5 mg daily.  Patient had covid19 again Oct 14. Still having a cough and breathing problems. Denies chest pain. Walks 1-1 1/2 3 times weekly. Working Architect some.   Past Medical History:  Diagnosis Date   Chronic combined systolic (congestive) and diastolic (congestive) heart failure (HCC)    Coronary atherosclerosis of native coronary artery    a. MI s/p BMS to [facility] 2007 with residual 90% diagonal, 50% Cx disease. b. stress test 08/2014 without ischemia.   Dilated aortic root (HCC)    HTN (hypertension)    Hyperlipidemia    Ischemic cardiomyopathy    Myocardial infarction ([place]) 06/20/2006   Obesity    Pulmonary nodules     Past Surgical History:  Procedure Laterality Date   CORONARY STENT PLACEMENT     None      Current Medications: Current Meds  Medication Sig   amLODipine (NORVASC) 5 MG tablet Take 1 tablet (5 mg total) by mouth daily.   cetirizine (ZYRTEC) 10 MG tablet Take 1 tablet (10 mg total) by mouth daily.    clopidogrel (PLAVIX) 75 MG tablet Take 1 tablet by mouth once daily   diphenhydrAMINE (BENADRYL) 25 MG tablet Take 1 tab as needed for allergic reactions   ENTRESTO 49-51 MG Take 1 tablet by mouth twice daily   ezetimibe (ZETIA) 10 MG tablet Take 1 tablet (10 mg total) by mouth daily. Start a few days after starting the Lasix/ Furosemide   furosemide (LASIX) 20 MG tablet Take 2 tablets (40 mg total) by mouth daily.   metoprolol succinate (TOPROL-XL) 100 MG 24 hr tablet TAKE 1 TABLET BY MOUTH ONCE DAILY . APPOINTMENT REQUIRED FOR FUTURE REFILLS .  CALL  [phone]   rosuvastatin (CRESTOR) 40 MG tablet Take 1 tablet by mouth once daily     Allergies:   Aspirin, Nsaids, and Tolmetin   Social History   Socioeconomic History   Marital status: Married    Spouse name: Not on file   Number of children: 2   Years of education: Not on file   Highest education level: Not on file  Occupational History   Occupation: Carpenter  Tobacco Use   Smoking status: Former    Types: Cigars   Smokeless tobacco: Never  Vaping Use   Vaping Use: Never used  Substance and Sexual Activity   Alcohol use: Yes    Alcohol/week: 42.0 standard drinks    Types: 42 Standard drinks or equivalent per  week    Comment: Drinks 4 per day   Drug use: No   Sexual activity: Not on file  Other Topics Concern   Not on file  Social History Narrative   Not on file   Social Determinants of Health   Financial Resource Strain: Not on file  Food Insecurity: Not on file  Transportation Needs: Not on file  Physical Activity: Not on file  Stress: Not on file  Social Connections: Not on file     Family History:  The patient's  family history includes Heart attack in his father.   ROS:   Please see the history of present illness.    ROS All other systems reviewed and are negative.   PHYSICAL EXAM:   VS:  BP 136/86   Pulse 73   Ht 5' 7.5" (1.715 m)   Wt 276 lb (125.2 kg)   SpO2 98%   BMI 42.59 kg/m   Physical  Exam  GEN: Obese, in no acute distress  Neck: no JVD, carotid bruits, or masses Cardiac:RRR; no murmurs, rubs, or gallops  Respiratory:  clear to auscultation bilaterally, normal work of breathing GI: soft, nontender, nondistended, + BS Ext: without cyanosis, clubbing, or edema, Good distal pulses bilaterally Neuro:  Alert and Oriented x 3, Psych: euthymic mood, full affect  Wt Readings from Last 3 Encounters:  05/07/21 276 lb (125.2 kg)  12/19/20 279 lb (126.6 kg)  11/06/20 277 lb (125.6 kg)      Studies/Labs Reviewed:   EKG:  EKG is not ordered today.    Recent Labs: 11/06/2020: Hemoglobin 14.9; NT-Pro BNP 416; Platelets 342; TSH 4.120 01/23/2021: ALT 23; BUN 19; Creatinine, Ser 0.92; Potassium 4.1; Sodium 137   Lipid Panel    Component Value Date/Time   CHOL 134 01/23/2021 0808   TRIG 103 01/23/2021 0808   HDL 46 01/23/2021 0808   CHOLHDL 2.9 01/23/2021 0808   CHOLHDL 2.8 06/09/2016 0859   VLDL 15 06/09/2016 0859   LDLCALC 69 01/23/2021 0808   LDLDIRECT 152.7 10/24/2009 0000    Additional studies/ records that were reviewed today include:    2D echo 12/06/20   1. Left ventricular ejection fraction, by estimation, is 45 to 50%. The  left ventricle has mildly decreased function. The left ventricle  demonstrates regional wall motion abnormalities (see scoring  diagram/findings for description).  Inferior/inferoseptal hypokinesis. There is mild left ventricular  hypertrophy. Left ventricular diastolic parameters are consistent with  Grade I diastolic dysfunction (impaired relaxation).   2. Right ventricular systolic function is normal. The right ventricular  size is normal. Tricuspid regurgitation signal is inadequate for assessing  PA pressure.   3. The mitral valve is normal in structure. No evidence of mitral valve  regurgitation. No evidence of mitral stenosis.   4. The aortic valve is tricuspid. Aortic valve regurgitation is not  visualized. No aortic stenosis  is present.   5. Aortic dilatation noted. There is mild dilatation of the aortic root,  measuring 40 mm.   6. The inferior vena cava is normal in size with greater than 50%  respiratory variability, suggesting right atrial pressure of 3 mmHg.   Comparison(s): 05/12/17 EF 40-45%.    2D echo 04/2017 - Left ventricle: The cavity size was normal. Wall thickness was    increased in a pattern of mild LVH. Systolic function was mildly    to moderately reduced. The estimated ejection fraction was in the    range of 40% to 45%. There is  hypokinesis of the inferior    myocardium. Features are consistent with a pseudonormal left    ventricular filling pattern, with concomitant abnormal relaxation    and increased filling pressure (grade 2 diastolic dysfunction).  - Aortic root: The aortic root was mildly dilated.  - Left atrium: The atrium was moderately dilated.  - Right ventricle: The cavity size was mildly dilated.  - Right atrium: The atrium was mildly dilated.   Impressions:   - Hypokinesis of the inferior basal wall with overall mild to    moderate LV dysfunction; moderate diastolic dysfunction; mildly    dilated aortic root; moderate LAE; mild RAE and RVE.     CT chest 11/17/2020 IMPRESSION: 1. 7 mm posterior right upper lobe pulmonary nodule with 5 mm left lower lobe perifissural nodule. Non-contrast chest CT at 3-6 months is recommended. If the nodules are stable at time of repeat CT, then future CT at 18-24 months (from today's scan) is considered optional for low-risk patients, but is recommended for high-risk patients. This recommendation follows the consensus statement: Guidelines for Management of Incidental Pulmonary Nodules Detected on CT Images: From the Fleischner Society 2017; Radiology 2017; 284:228-243. 2. Fluid in the esophagus may be related to dysmotility or reflux. 3. Aortic Atherosclerosis (ICD10-I70.0).     Electronically Signed   By: Misty Stanley M.D.   On:  11/19/2020 12:55     Risk Assessment/Calculations:         ASSESSMENT:    1. Coronary artery disease involving native coronary artery of native heart without angina pectoris   2. Ischemic cardiomyopathy   3. Essential hypertension   4. Hyperlipidemia, unspecified hyperlipidemia type   5. Dilated aortic root ([place])   6. Solitary pulmonary nodule      PLAN:  In order of problems listed above:  CAD status post MI treated with BMS to the mid RCA 2007 with residual 90% diagonal, 70 to 80% subbranch, 50% circumflex, 30% RCA-no angina. 150 min exercise  Ischemic cardiomyopathy ejection fraction 45 to 50% in 11/2020 now on entresto, toprol  Hypertension BP controlled  HLD-LDL 69 12/2020  Mildly dilated ascending aorta-40 mm on echo 11/2020  Pulmonary nodules-needs f/u CT-now and if stable f/u in 18-24 months. Hasn't seen PCP to schedule so I'll order  Covid 19 in October and still getting over.Says exercise capacity has gone down.  Shared Decision Making/Informed Consent        Medication Adjustments/Labs and Tests Ordered: Current medicines are reviewed at length with the patient today.  Concerns regarding medicines are outlined above.  Medication changes, Labs and Tests ordered today are listed in the Patient Instructions below. Patient Instructions  Medication Instructions:  Your physician recommends that you continue on your current medications as directed. Please refer to the Current Medication list given to you today.  *If you need a refill on your cardiac medications before your next appointment, please call your pharmacy*   Lab Work: None If you have labs (blood work) drawn today and your tests are completely normal, you will receive your results only by: [place] (if you have MyChart) OR A paper copy in the mail If you have any lab test that is abnormal or we need to change your treatment, we will call you to review the  results.   Testing/Procedures: Non-Cardiac CT scanning, (CAT scanning), is a noninvasive, special x-ray that produces cross-sectional images of the body using x-rays and a computer. CT scans help physicians diagnose and treat medical conditions. For  some CT exams, a contrast material is used to enhance visibility in the area of the body being studied. CT scans provide greater clarity and reveal more details than regular x-ray exams.    Follow-Up: At [facility], you and your health needs are our priority.  As part of our continuing mission to provide you with exceptional heart care, we have created designated Provider Care Teams.  These Care Teams include your primary Cardiologist (physician) and Advanced Practice Providers (APPs -  Physician Assistants and Nurse Practitioners) who all work together to provide you with the care you need, when you need it.  We recommend signing up for the patient portal called "MyChart".  Sign up information is provided on this After Visit Summary.  MyChart is used to connect with patients for Virtual Visits (Telemedicine).  Patients are able to view lab/test results, encounter notes, upcoming appointments, etc.  Non-urgent messages can be sent to your provider as well.   To learn more about what you can do with MyChart, go to [email or web address].    Your next appointment:   6 month(s)  The format for your next appointment:   In Person  Provider:   Lauree Chandler, MD {   Other Instructions Your provider recommends that you maintain 150 minutes per week of moderate aerobic activity.     Sumner Boast, PA-C  05/07/2021 12:53 PM    [facility] [address], [place], [place]  [address] Phone: [phone]; Fax: [phone]

## 2021-05-05 ENCOUNTER — Ambulatory Visit (HOSPITAL_BASED_OUTPATIENT_CLINIC_OR_DEPARTMENT_OTHER): Payer: BC Managed Care – PPO | Admitting: Cardiology

## 2021-05-07 ENCOUNTER — Ambulatory Visit (INDEPENDENT_AMBULATORY_CARE_PROVIDER_SITE_OTHER): Payer: BC Managed Care – PPO | Admitting: Physician Assistant

## 2021-05-07 ENCOUNTER — Encounter: Payer: Self-pay | Admitting: Physician Assistant

## 2021-05-07 ENCOUNTER — Other Ambulatory Visit: Payer: Self-pay

## 2021-05-07 VITALS — BP 136/86 | HR 73 | Ht 67.5 in | Wt 276.0 lb

## 2021-05-07 DIAGNOSIS — I2510 Atherosclerotic heart disease of native coronary artery without angina pectoris: Secondary | ICD-10-CM

## 2021-05-07 DIAGNOSIS — E785 Hyperlipidemia, unspecified: Secondary | ICD-10-CM

## 2021-05-07 DIAGNOSIS — I77810 Thoracic aortic ectasia: Secondary | ICD-10-CM

## 2021-05-07 DIAGNOSIS — I10 Essential (primary) hypertension: Secondary | ICD-10-CM

## 2021-05-07 DIAGNOSIS — R911 Solitary pulmonary nodule: Secondary | ICD-10-CM

## 2021-05-07 DIAGNOSIS — I255 Ischemic cardiomyopathy: Secondary | ICD-10-CM

## 2021-05-07 NOTE — Patient Instructions (Addendum)
Medication Instructions:  Your physician recommends that you continue on your current medications as directed. Please refer to the Current Medication list given to you today.  *If you need a refill on your cardiac medications before your next appointment, please call your pharmacy*   Lab Work: None If you have labs (blood work) drawn today and your tests are completely normal, you will receive your results only by: [place] (if you have MyChart) OR A paper copy in the mail If you have any lab test that is abnormal or we need to change your treatment, we will call you to review the results.   Testing/Procedures: Non-Cardiac CT scanning, (CAT scanning), is a noninvasive, special x-ray that produces cross-sectional images of the body using x-rays and a computer. CT scans help physicians diagnose and treat medical conditions. For some CT exams, a contrast material is used to enhance visibility in the area of the body being studied. CT scans provide greater clarity and reveal more details than regular x-ray exams.    Follow-Up: At [facility], you and your health needs are our priority.  As part of our continuing mission to provide you with exceptional heart care, we have created designated Provider Care Teams.  These Care Teams include your primary Cardiologist (physician) and Advanced Practice Providers (APPs -  Physician Assistants and Nurse Practitioners) who all work together to provide you with the care you need, when you need it.  We recommend signing up for the patient portal called "MyChart".  Sign up information is provided on this After Visit Summary.  MyChart is used to connect with patients for Virtual Visits (Telemedicine).  Patients are able to view lab/test results, encounter notes, upcoming appointments, etc.  Non-urgent messages can be sent to your provider as well.   To learn more about what you can do with MyChart, go to [email or web address].    Your next  appointment:   6 month(s)  The format for your next appointment:   In Person  Provider:   Lauree Chandler, MD {   Other Instructions Your provider recommends that you maintain 150 minutes per week of moderate aerobic activity.

## 2021-07-17 ENCOUNTER — Ambulatory Visit: Payer: BC Managed Care – PPO | Admitting: Cardiovascular Disease

## 2021-08-29 IMAGING — CT CT CHEST W/O CM
1 series · 15 of 34 positions shown, 19 images · non-contrast
Comparison: Chest x-ray 11/06/2020

CLINICAL DATA: Right lung nodules on recent chest x-ray.

EXAM:
CT CHEST WITHOUT CONTRAST
TECHNIQUE: Multidetector CT imaging of the chest was performed following the
standard protocol without IV contrast.

[Series 2: chest w/(date) · axial · 0.98mm/px · z∈[-357,-49]mm · 15 of 182 slices shown, 19 images]
[im 14/182  mediastinal]
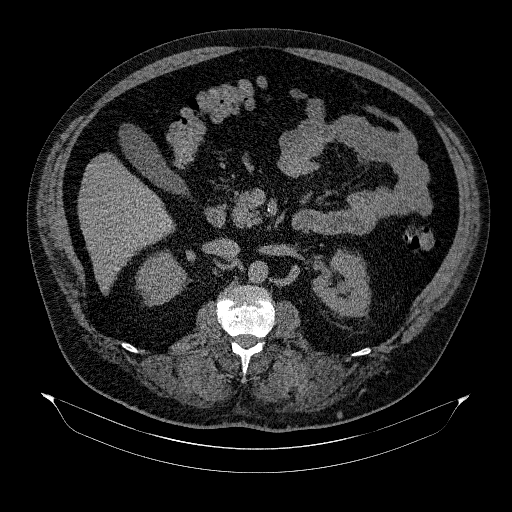
[im 14/182  lung]
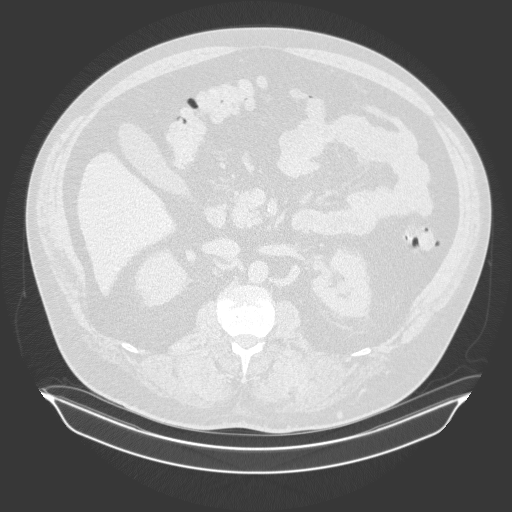
[im 27/182  lung]
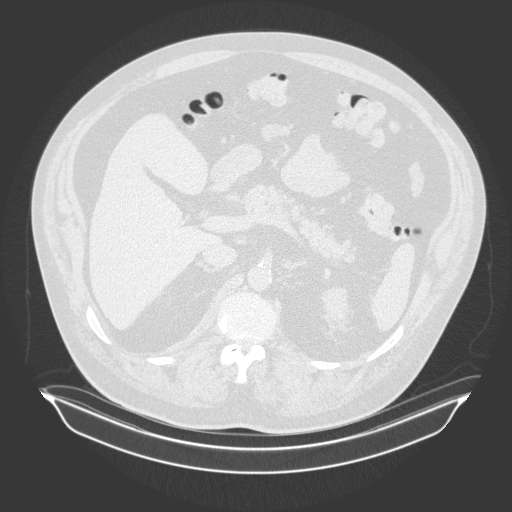
[im 37/182  lung]
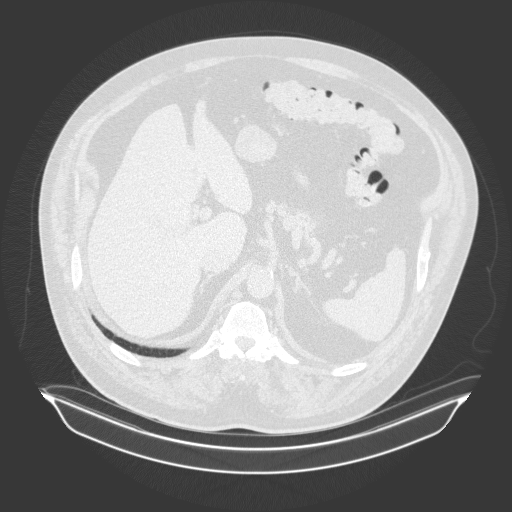
[im 47/182  lung]
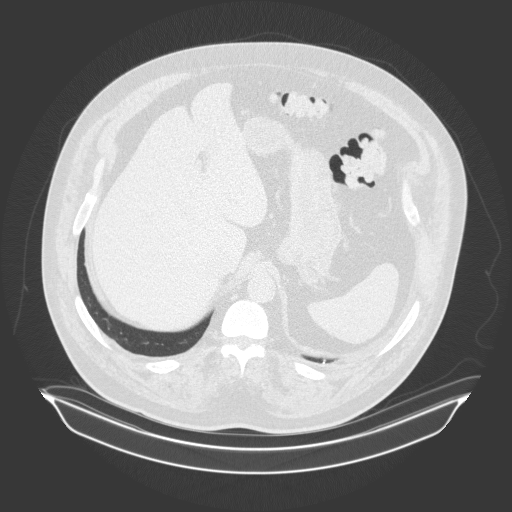
[im 61/182  mediastinal]
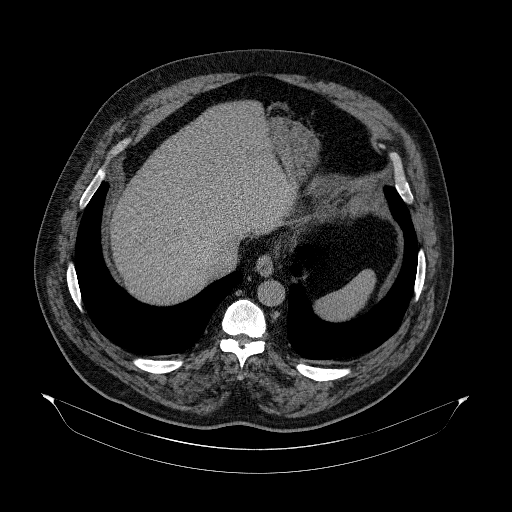
[im 61/182  lung]
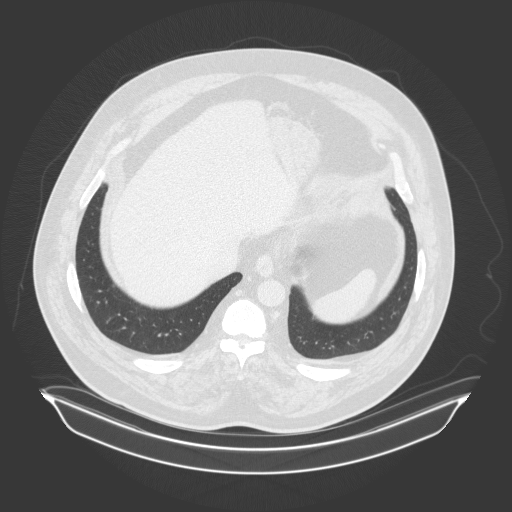
[im 73/182  lung]
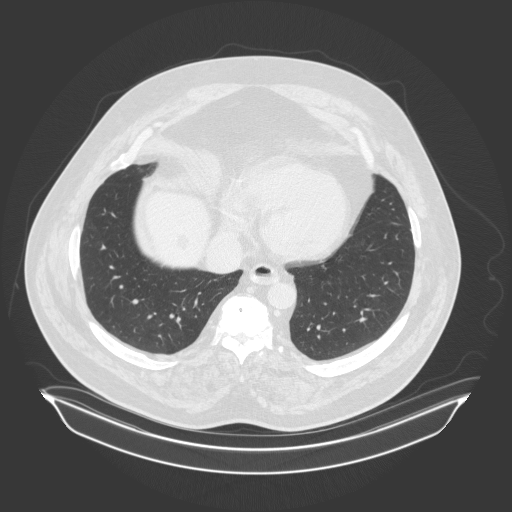
[im 81/182  lung]
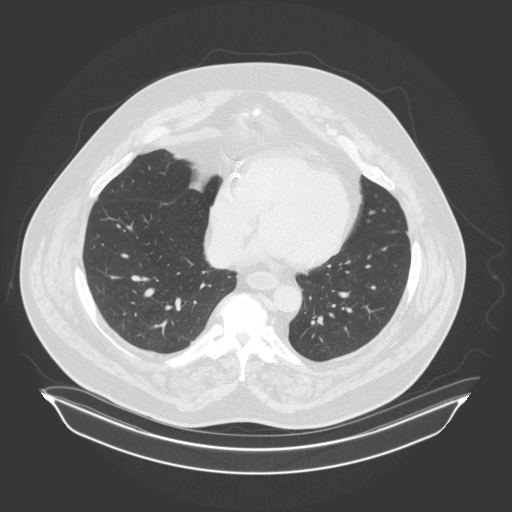
[im 94/182  lung]
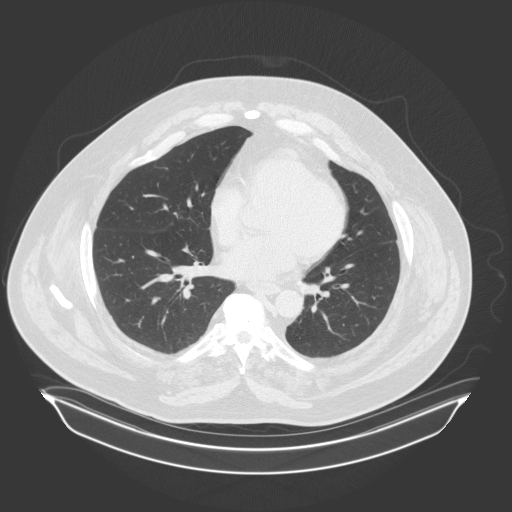
[im 101/182  mediastinal]
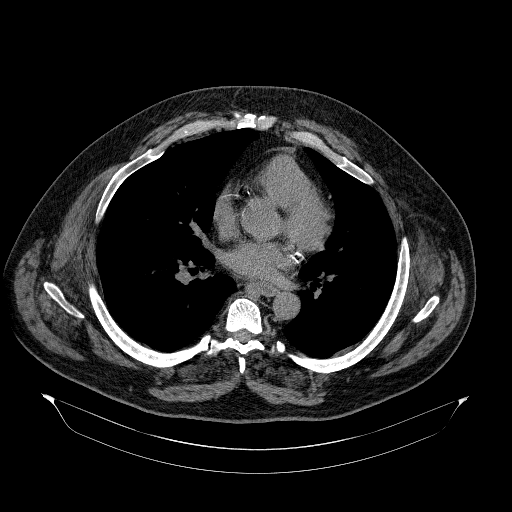
[im 101/182  lung]
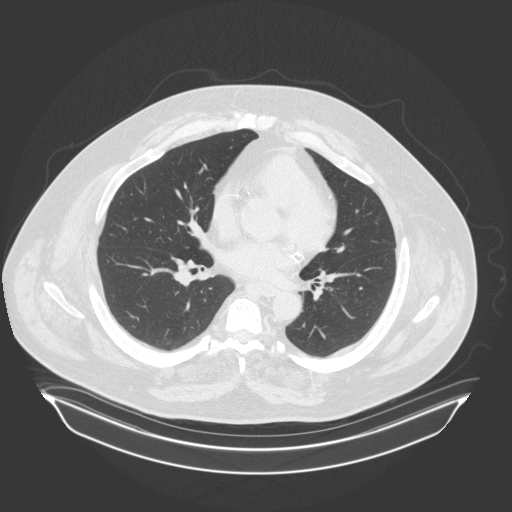
[im 109/182  lung]
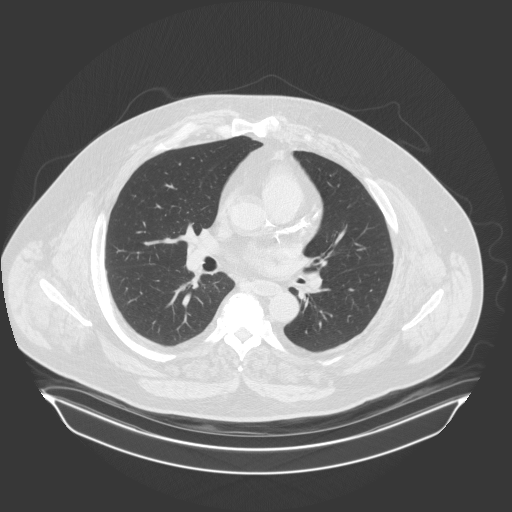
[im 121/182  lung]
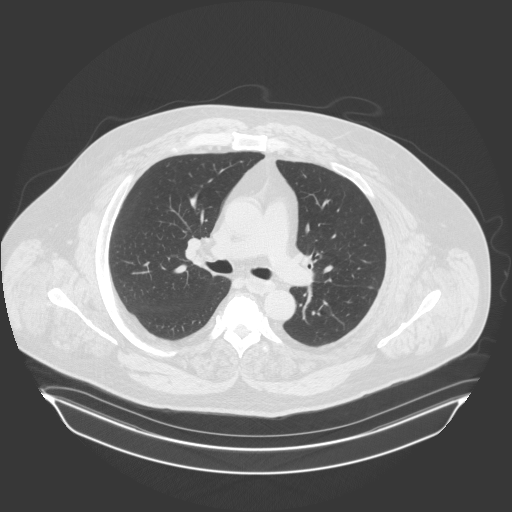
[im 135/182  lung]
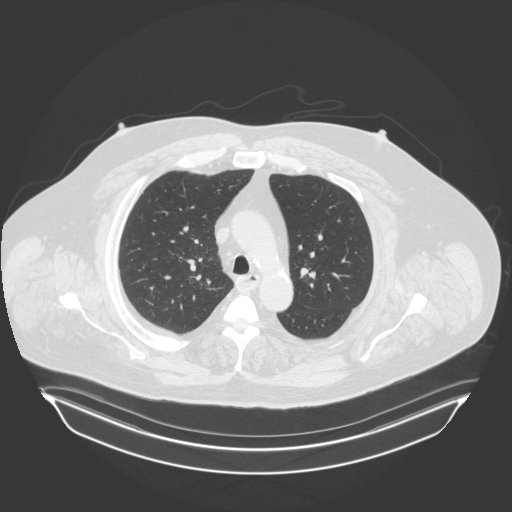
[im 145/182  mediastinal]
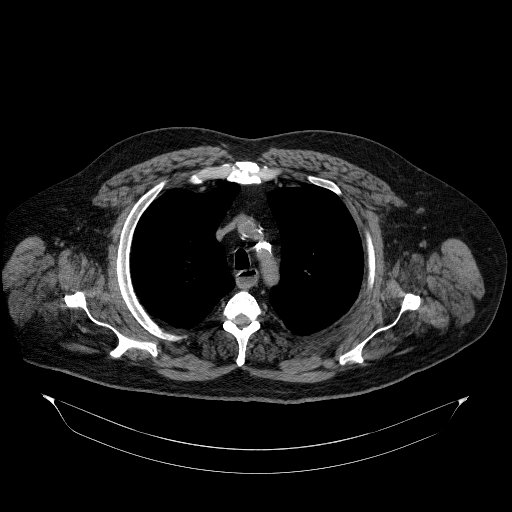
[im 145/182  lung]
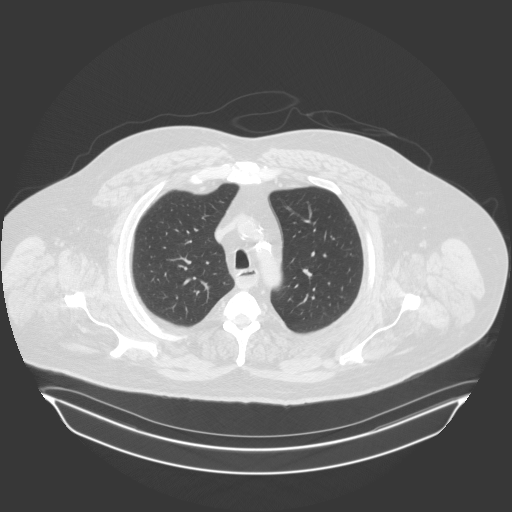
[im 155/182  lung]
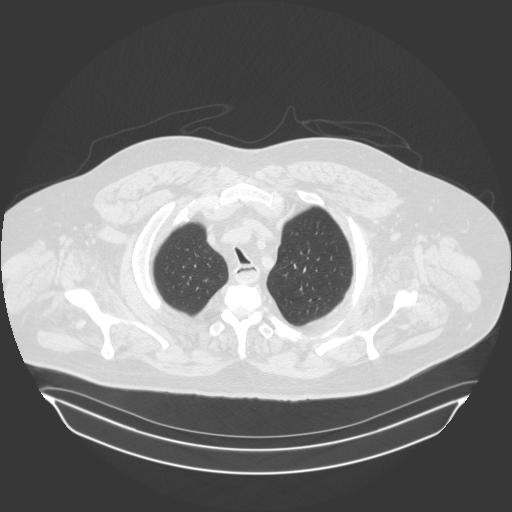
[im 168/182  lung]
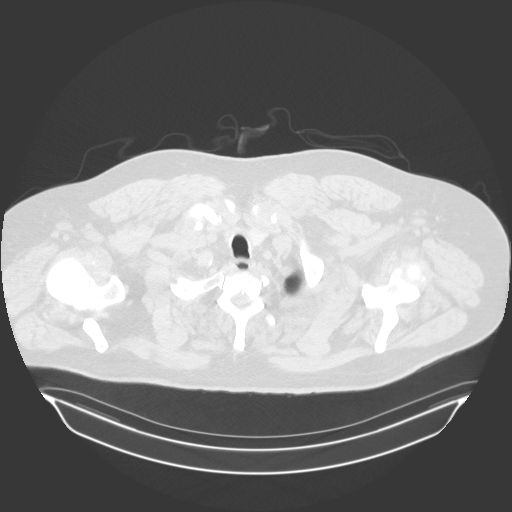

[15 of 34 positions shown; findings below may reference images not displayed]

FINDINGS: Cardiovascular: The heart size is normal. No substantial pericardial
effusion. Coronary artery calcification is evident. Atherosclerotic
calcification is noted in the wall of the thoracic aorta.

Mediastinum/Nodes: No mediastinal lymphadenopathy. No evidence for
gross hilar lymphadenopathy although assessment is limited by the
lack of intravenous contrast on today's study. Fluid in the
esophagus may be related to dysmotility or reflux. There is no
axillary lymphadenopathy.

Lungs/Pleura: 7 mm posterior right upper lobe nodule identified on
image 70/series 5. 5 mm left lower lobe perifissural nodule
identified on 63/5. No focal airspace consolidation. No pleural
effusion.

Upper Abdomen: Small hypoattenuating lesions in the liver too small
to characterize but likely benign.

Musculoskeletal: No worrisome lytic or sclerotic osseous
abnormality.
IMPRESSION: 1. 7 mm posterior right upper lobe pulmonary nodule with 5 mm left
lower lobe perifissural nodule. Non-contrast chest CT at 3-6 months
is recommended. If the nodules are stable at time of repeat CT, then
future CT at 18-24 months (from today's scan) is considered optional
for low-risk patients, but is recommended for high-risk patients.
This recommendation follows the consensus statement: Guidelines for
Management of Incidental Pulmonary Nodules Detected on CT Images:
2. Fluid in the esophagus may be related to dysmotility or reflux.
3. Aortic Atherosclerosis ([id number]-D0M.M).

## 2021-09-29 ENCOUNTER — Other Ambulatory Visit: Payer: Self-pay | Admitting: Cardiovascular Disease

## 2021-11-05 ENCOUNTER — Ambulatory Visit (INDEPENDENT_AMBULATORY_CARE_PROVIDER_SITE_OTHER)
Admission: RE | Admit: 2021-11-05 | Discharge: 2021-11-05 | Disposition: A | Discharge status: Home / Self Care | Payer: BC Managed Care – PPO | Source: Ambulatory Visit | Attending: Physician Assistant | Admitting: Physician Assistant

## 2021-11-05 DIAGNOSIS — R911 Solitary pulmonary nodule: Secondary | ICD-10-CM

## 2021-11-05 DIAGNOSIS — R918 Other nonspecific abnormal finding of lung field: Secondary | ICD-10-CM | POA: Diagnosis not present

## 2021-11-08 ENCOUNTER — Telehealth: Payer: Self-pay | Admitting: Physician Assistant

## 2021-11-08 NOTE — Telephone Encounter (Signed)
Patient returning a call to discuss CT results  ?

## 2021-11-08 NOTE — Progress Notes (Deleted)
No chief complaint on file.  History of Present Illness: 61 yo male with history of CAD and HTN here today for cardiac follow up. Cardiac cath 2007 with 90% diagonal, 50% Circumflex, 100% mid RCA. A bare metal stent was placed in the mid RCA. He has had no cardiac cath since 2007. Nuclear stress test March 2016 with no ischemia. Echo June 2022 with LVEF=45-50%. Hypokinesis of the inferior wall. Grade 1 diastolic dysfunction. CT chest may 2023 with stable pulmonary nodules.   He is here today for follow up. The patient denies any chest pain, dyspnea, palpitations, lower extremity edema, orthopnea, PND, dizziness, near syncope or syncope.     Primary Care Physician: Alroy Dust, L.Marlou Sa, MD   Past Medical History:  Diagnosis Date   Chronic combined systolic (congestive) and diastolic (congestive) heart failure (HCC)    Coronary atherosclerosis of native coronary artery    a. MI s/p BMS to [facility] 2007 with residual 90% diagonal, 50% Cx disease. b. stress test 08/2014 without ischemia.   Dilated aortic root (HCC)    HTN (hypertension)    Hyperlipidemia    Ischemic cardiomyopathy    Myocardial infarction ([place]) 06/20/2006   Obesity    Pulmonary nodules     Past Surgical History:  Procedure Laterality Date   CORONARY STENT PLACEMENT     None      Current Outpatient Medications  Medication Sig Dispense Refill   amLODipine (NORVASC) 5 MG tablet Take 1 tablet (5 mg total) by mouth daily. 180 tablet 3   cetirizine (ZYRTEC) 10 MG tablet Take 1 tablet (10 mg total) by mouth daily. 30 tablet 11   clopidogrel (PLAVIX) 75 MG tablet Take 1 tablet by mouth once daily 90 tablet 2   diphenhydrAMINE (BENADRYL) 25 MG tablet Take 1 tab as needed for allergic reactions     ENTRESTO 49-51 MG Take 1 tablet by mouth twice daily 60 tablet 9   ezetimibe (ZETIA) 10 MG tablet Take 1 tablet (10 mg total) by mouth daily. Start a few days after starting the Lasix/ Furosemide 90 tablet 3   furosemide (LASIX) 20 MG  tablet Take 2 tablets (40 mg total) by mouth daily. 60 tablet 11   ipratropium (ATROVENT) 0.03 % nasal spray Place 2 sprays into both nostrils 2 (two) times daily. (Patient not taking: Reported on 05/07/2021) 30 mL 0   metoprolol succinate (TOPROL-XL) 100 MG 24 hr tablet TAKE 1 TABLET BY MOUTH ONCE DAILY . APPOINTMENT REQUIRED FOR FUTURE REFILLS .  CALL  [phone] 90 tablet 3   mometasone (NASONEX) 50 MCG/ACT nasal spray USE TWO SPRAY(S) IN EACH NOSTRIL ONCE DAILY (Patient not taking: Reported on 05/07/2021) 17 g 2   rosuvastatin (CRESTOR) 40 MG tablet Take 1 tablet by mouth once daily 90 tablet 0   No current facility-administered medications for this visit.    Allergies  Allergen Reactions   Aspirin Swelling   Nsaids Swelling   Tolmetin Swelling    Social History   Socioeconomic History   Marital status: Married    Spouse name: Not on file   Number of children: 2   Years of education: Not on file   Highest education level: Not on file  Occupational History   Occupation: Carpenter  Tobacco Use   Smoking status: Former    Types: Cigars   Smokeless tobacco: Never  Vaping Use   Vaping Use: Never used  Substance and Sexual Activity   Alcohol use: Yes    Alcohol/week: 42.0 standard  drinks    Types: 42 Standard drinks or equivalent per week    Comment: Drinks 4 per day   Drug use: No   Sexual activity: Not on file  Other Topics Concern   Not on file  Social History Narrative   Not on file   Social Determinants of Health   Financial Resource Strain: Not on file  Food Insecurity: Not on file  Transportation Needs: Not on file  Physical Activity: Not on file  Stress: Not on file  Social Connections: Not on file  Intimate Partner Violence: Not on file    Family History  Problem Relation Age of Onset   Heart attack Father    Colon cancer Neg Hx    Colon polyps Neg Hx    Esophageal cancer Neg Hx    Kidney disease Neg Hx     Review of Systems:  As stated in the  HPI and otherwise negative.   There were no vitals taken for this visit.  Physical Examination: General: Well developed, well nourished, NAD  HEENT: OP clear, mucus membranes moist  SKIN: warm, dry. No rashes. Neuro: No focal deficits  Musculoskeletal: Muscle strength 5/5 all ext  Psychiatric: Mood and affect normal  Neck: No JVD, no carotid bruits, no thyromegaly, no lymphadenopathy.  Lungs:Clear bilaterally, no wheezes, rhonci, crackles Cardiovascular: Regular rate and rhythm. No murmurs, gallops or rubs. Abdomen:Soft. Bowel sounds present. Non-tender.  Extremities: No lower extremity edema. Pulses are 2 + in the bilateral DP/PT.  Echo June 2022:  1. Left ventricular ejection fraction, by estimation, is 45 to 50%. The  left ventricle has mildly decreased function. The left ventricle  demonstrates regional wall motion abnormalities (see scoring  diagram/findings for description).  Inferior/inferoseptal hypokinesis. There is mild left ventricular  hypertrophy. Left ventricular diastolic parameters are consistent with  Grade I diastolic dysfunction (impaired relaxation).   2. Right ventricular systolic function is normal. The right ventricular  size is normal. Tricuspid regurgitation signal is inadequate for assessing  PA pressure.   3. The mitral valve is normal in structure. No evidence of mitral valve  regurgitation. No evidence of mitral stenosis.   4. The aortic valve is tricuspid. Aortic valve regurgitation is not  visualized. No aortic stenosis is present.   5. Aortic dilatation noted. There is mild dilatation of the aortic root,  measuring 40 mm.   6. The inferior vena cava is normal in size with greater than 50%  respiratory variability, suggesting right atrial pressure of 3 mmHg.   EKG:  EKG is *** ordered today. The ekg ordered today demonstrates  Recent Labs: 01/23/2021: ALT 23; BUN 19; Creatinine, Ser 0.92; Potassium 4.1; Sodium 137   Lipid Panel    Component  Value Date/Time   CHOL 134 01/23/2021 0808   TRIG 103 01/23/2021 0808   HDL 46 01/23/2021 0808   CHOLHDL 2.9 01/23/2021 0808   CHOLHDL 2.8 06/09/2016 0859   VLDL 15 06/09/2016 0859   LDLCALC 69 01/23/2021 0808   LDLDIRECT 152.7 10/24/2009 0000     Wt Readings from Last 3 Encounters:  05/07/21 276 lb (125.2 kg)  12/19/20 279 lb (126.6 kg)  11/06/20 277 lb (125.6 kg)     Other studies Reviewed: Additional studies/ records that were reviewed today include: . Review of the above records demonstrates:   Assessment and Plan:   1. HTN: BP is controlled. No changes  2. CAD without angina: No chest pain. Continue Plavix, statin and beta blocker.  3. Hyperlipidemia: LDL at goal in July 2022. Continue statin.    4. Ischemic cardiomyopathy: LVEF=45-50% by echo in 2022. Inferior wall hypokinesis. Prior inferior MI. No volume overload on exam. Continue Entresto and beta blocker.   The following changes have been made:  no change  Labs/ tests ordered today include:   No orders of the defined types were placed in this encounter.   Disposition:   F/U with me in 12 months  Signed, Lauree Chandler, MD 11/08/2021 1:20 PM    [facility] [address], [place], [place]  [address] Phone: [phone]; Fax: [phone]

## 2021-11-08 NOTE — Telephone Encounter (Signed)
Spoke with pt and advised per Ermalinda Barrios, PA-C  - CT shows stable pulmonary nodules. Will need repeat CT 18 months-please have PCP order. Fluid in esophagus could be reflux. F/u with PCP.  Pt verbalizes understanding and agrees with current plan. ?

## 2021-11-11 ENCOUNTER — Ambulatory Visit: Payer: BC Managed Care – PPO | Admitting: Cardiovascular Disease

## 2021-11-19 ENCOUNTER — Other Ambulatory Visit: Payer: Self-pay | Admitting: Physician Assistant

## 2021-11-19 ENCOUNTER — Other Ambulatory Visit: Payer: Self-pay | Admitting: Cardiovascular Disease

## 2021-11-27 ENCOUNTER — Ambulatory Visit (INDEPENDENT_AMBULATORY_CARE_PROVIDER_SITE_OTHER): Payer: BC Managed Care – PPO | Admitting: Cardiovascular Disease

## 2021-11-27 ENCOUNTER — Encounter: Payer: Self-pay | Admitting: Cardiovascular Disease

## 2021-11-27 VITALS — BP 140/86 | HR 62 | Ht 67.5 in | Wt 283.0 lb

## 2021-11-27 DIAGNOSIS — I2510 Atherosclerotic heart disease of native coronary artery without angina pectoris: Secondary | ICD-10-CM

## 2021-11-27 DIAGNOSIS — I255 Ischemic cardiomyopathy: Secondary | ICD-10-CM

## 2021-11-27 DIAGNOSIS — I10 Essential (primary) hypertension: Secondary | ICD-10-CM | POA: Diagnosis not present

## 2021-11-27 DIAGNOSIS — E785 Hyperlipidemia, unspecified: Secondary | ICD-10-CM | POA: Diagnosis not present

## 2021-11-27 NOTE — Patient Instructions (Signed)
Medication Instructions:  Your physician recommends that you continue on your current medications as directed. Please refer to the Current Medication list given to you today.  *If you need a refill on your cardiac medications before your next appointment, please call your pharmacy*   Lab Work: None If you have labs (blood work) drawn today and your tests are completely normal, you will receive your results only by: [place] (if you have MyChart) OR A paper copy in the mail If you have any lab test that is abnormal or we need to change your treatment, we will call you to review the results.   Testing/Procedures: None   Follow-Up: At [facility], you and your health needs are our priority.  As part of our continuing mission to provide you with exceptional heart care, we have created designated Provider Care Teams.  These Care Teams include your primary Cardiologist (physician) and Advanced Practice Providers (APPs -  Physician Assistants and Nurse Practitioners) who all work together to provide you with the care you need, when you need it.  We recommend signing up for the patient portal called "MyChart".  Sign up information is provided on this After Visit Summary.  MyChart is used to connect with patients for Virtual Visits (Telemedicine).  Patients are able to view lab/test results, encounter notes, upcoming appointments, etc.  Non-urgent messages can be sent to your provider as well.   To learn more about what you can do with MyChart, go to [email or web address].    Your next appointment:   12 month(s)  The format for your next appointment:   In Person  Provider:   Lauree Chandler, MD     Important Information About Sugar

## 2021-11-27 NOTE — Progress Notes (Signed)
Chief Complaint  Patient presents with   Follow-up    CAD   History of Present Illness: 61 yo male with history of CAD, chronic combined diastolic and systolic CHF and HTN here today for cardiac follow up. Cardiac cath 2007 with 90% diagonal, 50% Circumflex, 100% mid RCA. A bare metal stent was placed in the mid RCA. He has had no cardiac cath since 2007. Nuclear stress test March 2016 with no ischemia. He was seen in June 2022 and was complaining of dyspnea. Echo June 2022 with LVEF=45-50%. Hypokinesis of the inferior wall. Grade 1 diastolic dysfunction. His lasix was increased and he was started on Entresto. CT chest May 2023 with stable pulmonary nodules. No dilation of the ascending aorta.    He is here today for follow up. The patient denies any chest pain, dyspnea, palpitations,  orthopnea, PND, dizziness, near syncope or syncope. He is seeing GI soon to evaluate his esophageal issues. He has mild LE edema.     Primary Care Physician: Alroy Dust, L.Marlou Sa, MD  Past Medical History:  Diagnosis Date   Chronic combined systolic (congestive) and diastolic (congestive) heart failure (HCC)    Coronary atherosclerosis of native coronary artery    a. MI s/p BMS to [facility] 2007 with residual 90% diagonal, 50% Cx disease. b. stress test 08/2014 without ischemia.   Dilated aortic root (HCC)    HTN (hypertension)    Hyperlipidemia    Ischemic cardiomyopathy    Myocardial infarction ([place]) 06/20/2006   Obesity    Pulmonary nodules     Past Surgical History:  Procedure Laterality Date   CORONARY STENT PLACEMENT     None      Current Outpatient Medications  Medication Sig Dispense Refill   cetirizine (ZYRTEC) 10 MG tablet Take 1 tablet (10 mg total) by mouth daily. 30 tablet 11   clopidogrel (PLAVIX) 75 MG tablet Take 1 tablet by mouth once daily 90 tablet 2   diphenhydrAMINE (BENADRYL) 25 MG tablet Take 1 tab as needed for allergic reactions     ENTRESTO 49-51 MG Take 1 tablet by mouth twice  daily 60 tablet 9   ezetimibe (ZETIA) 10 MG tablet Take 1 tablet (10 mg total) by mouth daily. Start a few days after starting the Lasix/ Furosemide 90 tablet 3   furosemide (LASIX) 20 MG tablet TAKE 1 TABLET BY MOUTH ONCE DAILY IN THE MORNING 90 tablet 1   ipratropium (ATROVENT) 0.03 % nasal spray Place 2 sprays into both nostrils 2 (two) times daily. 30 mL 0   metoprolol succinate (TOPROL-XL) 100 MG 24 hr tablet Take 1 tablet (100 mg total) by mouth daily. 90 tablet 1   mometasone (NASONEX) 50 MCG/ACT nasal spray USE TWO SPRAY(S) IN EACH NOSTRIL ONCE DAILY 17 g 2   rosuvastatin (CRESTOR) 40 MG tablet Take 1 tablet by mouth once daily 90 tablet 0   amLODipine (NORVASC) 5 MG tablet Take 1 tablet (5 mg total) by mouth daily. 180 tablet 3   No current facility-administered medications for this visit.    Allergies  Allergen Reactions   Aspirin Swelling   Nsaids Swelling   Tolmetin Swelling    Social History   Socioeconomic History   Marital status: Married    Spouse name: Not on file   Number of children: 2   Years of education: Not on file   Highest education level: Not on file  Occupational History   Occupation: Carpenter  Tobacco Use   Smoking status: Former  Types: Cigars   Smokeless tobacco: Never  Vaping Use   Vaping Use: Never used  Substance and Sexual Activity   Alcohol use: Yes    Alcohol/week: 42.0 standard drinks    Types: 42 Standard drinks or equivalent per week    Comment: Drinks 4 per day   Drug use: No   Sexual activity: Not on file  Other Topics Concern   Not on file  Social History Narrative   Not on file   Social Determinants of Health   Financial Resource Strain: Not on file  Food Insecurity: Not on file  Transportation Needs: Not on file  Physical Activity: Not on file  Stress: Not on file  Social Connections: Not on file  Intimate Partner Violence: Not on file    Family History  Problem Relation Age of Onset   Heart attack Father     Colon cancer Neg Hx    Colon polyps Neg Hx    Esophageal cancer Neg Hx    Kidney disease Neg Hx     Review of Systems:  As stated in the HPI and otherwise negative.   BP 140/86   Pulse 62   Ht 5' 7.5" (1.715 m)   Wt 283 lb (128.4 kg)   SpO2 98%   BMI 43.67 kg/m   Physical Examination: General: Well developed, well nourished, NAD  HEENT: OP clear, mucus membranes moist  SKIN: warm, dry. No rashes. Neuro: No focal deficits  Musculoskeletal: Muscle strength 5/5 all ext  Psychiatric: Mood and affect normal  Neck: No JVD, no carotid bruits, no thyromegaly, no lymphadenopathy.  Lungs:Clear bilaterally, no wheezes, rhonci, crackles Cardiovascular: Regular rate and rhythm. No murmurs, gallops or rubs. Abdomen:Soft. Bowel sounds present. Non-tender.  Extremities: 1+ bilateral lower extremity edema. Pulses are 2 + in the bilateral DP/PT.  Echo May 2022:   1. Left ventricular ejection fraction, by estimation, is 45 to 50%. The  left ventricle has mildly decreased function. The left ventricle  demonstrates regional wall motion abnormalities (see scoring  diagram/findings for description).  Inferior/inferoseptal hypokinesis. There is mild left ventricular  hypertrophy. Left ventricular diastolic parameters are consistent with  Grade I diastolic dysfunction (impaired relaxation).   2. Right ventricular systolic function is normal. The right ventricular  size is normal. Tricuspid regurgitation signal is inadequate for assessing  PA pressure.   3. The mitral valve is normal in structure. No evidence of mitral valve  regurgitation. No evidence of mitral stenosis.   4. The aortic valve is tricuspid. Aortic valve regurgitation is not  visualized. No aortic stenosis is present.   5. Aortic dilatation noted. There is mild dilatation of the aortic root,  measuring 40 mm.   6. The inferior vena cava is normal in size with greater than 50%  respiratory variability, suggesting right atrial  pressure of 3 mmHg.   Comparison(s): 05/12/17 EF 40-45%.   EKG:  EKG is ordered today. The ekg ordered today demonstrates: sinus, 1st degree AV block. Inferior MI  Recent Labs: 01/23/2021: ALT 23; BUN 19; Creatinine, Ser 0.92; Potassium 4.1; Sodium 137   Lipid Panel    Component Value Date/Time   CHOL 134 01/23/2021 0808   TRIG 103 01/23/2021 0808   HDL 46 01/23/2021 0808   CHOLHDL 2.9 01/23/2021 0808   CHOLHDL 2.8 06/09/2016 0859   VLDL 15 06/09/2016 0859   LDLCALC 69 01/23/2021 0808   LDLDIRECT 152.7 10/24/2009 0000     Wt Readings from Last 3 Encounters:  11/27/21 283  lb (128.4 kg)  05/07/21 276 lb (125.2 kg)  12/19/20 279 lb (126.6 kg)     Other studies Reviewed: Additional studies/ records that were reviewed today include: . Review of the above records demonstrates:   Assessment and Plan:   1. HTN: BP is controlled. No changes   2. CAD without angina: No chest pain. Continue Plavix, statin and beta blocker.      3. Hyperlipidemia: LDL at goal in July 2022. Continue statin.     4. Ischemic cardiomyopathy: LVEF=45-50% by echo in 2022. Inferior wall hypokinesis. Prior inferior MI. Mild volume overload on exam. Continue Entresto and beta blocker. Increase Lasix to 40 mg daily.    The following changes have been made:  no change  Labs/ tests ordered today include:   Orders Placed This Encounter  Procedures   EKG 12-Lead    Disposition:   F/U with me in 12 months  Signed, Lauree Chandler, MD 11/27/2021 4:56 PM    [facility] [place], [place], [place]  [address] Phone: [phone]; Fax: [phone]

## 2021-12-03 ENCOUNTER — Ambulatory Visit: Payer: BC Managed Care – PPO | Admitting: Gastroenterology

## 2021-12-09 ENCOUNTER — Other Ambulatory Visit: Payer: Self-pay | Admitting: Physician Assistant

## 2021-12-24 ENCOUNTER — Other Ambulatory Visit: Payer: Self-pay | Admitting: Cardiovascular Disease

## 2021-12-25 ENCOUNTER — Ambulatory Visit (INDEPENDENT_AMBULATORY_CARE_PROVIDER_SITE_OTHER): Payer: BC Managed Care – PPO | Admitting: Internal Medicine

## 2021-12-25 ENCOUNTER — Telehealth: Payer: Self-pay

## 2021-12-25 ENCOUNTER — Encounter: Payer: Self-pay | Admitting: Internal Medicine

## 2021-12-25 VITALS — BP 128/84 | HR 63 | Ht 67.0 in | Wt 283.6 lb

## 2021-12-25 DIAGNOSIS — R1310 Dysphagia, unspecified: Secondary | ICD-10-CM

## 2021-12-25 DIAGNOSIS — Z1211 Encounter for screening for malignant neoplasm of colon: Secondary | ICD-10-CM | POA: Diagnosis not present

## 2021-12-25 DIAGNOSIS — Z7902 Long term (current) use of antithrombotics/antiplatelets: Secondary | ICD-10-CM

## 2021-12-25 DIAGNOSIS — R933 Abnormal findings on diagnostic imaging of other parts of digestive tract: Secondary | ICD-10-CM

## 2021-12-25 MED ORDER — PLENVU 140 G PO SOLR: 140.0000 g | ORAL | 0 refills | Status: DC

## 2021-12-25 NOTE — Telephone Encounter (Signed)
Left message for the pt to call back and s/w the pre op team as we will need to review medications instructions per Dr. Angelena Form.

## 2021-12-25 NOTE — Telephone Encounter (Signed)
[facility] Pre-operative Risk Assessment     Request for surgical clearance:     Endoscopy Procedure  What type of surgery is being performed?     EGD/Colon  When is this surgery scheduled?     02-12-2022  What type of clearance is required ?   Pharmacy  Are there any medications that need to be held prior to surgery and how long? Yes, Plavix, 5 day hold  Practice name and name of physician performing surgery?      [facility]  What is your office phone and fax number?      Phone- [phone]  [phone]  Anesthesia type (None, local, MAC, general) ?       MAC

## 2021-12-25 NOTE — Telephone Encounter (Signed)
   Name: Luis Mitchell  DOB: [birth year]  MRN: [id number]   Primary Cardiologist: Lauree Chandler, MD  Chart reviewed as part of pre-operative protocol coverage.  Malacai Grantz Villamizar was last seen on 11/27/21 by Dr. Angelena Form.    We are asked for guidance to hold plavix for endoscopy.   Pt has a history of PCI in 2007 with residual CAD (90% diagonal, 50% LCX). OK to hold plavix for 5 days prior to procedure and resume when safe to do so. Pt should take 81 mg ASA while holding plavix given his CAD.  I will route this recommendation to the requesting party via Epic fax function and remove from pre-op pool. Please call with questions.  Tami Lin Kavan Devan, PA 12/25/2021, 11:48 AM

## 2021-12-25 NOTE — Patient Instructions (Addendum)
If you are age 63 or older, your body mass index should be between 23-30. Your Body mass index is 44.42 kg/m. If this is out of the aforementioned range listed, please consider follow up with your Primary Care Provider.  If you are age 57 or younger, your body mass index should be between 19-25. Your Body mass index is 44.42 kg/m. If this is out of the aformentioned range listed, please consider follow up with your Primary Care Provider.   ________________________________________________________  The [facility] providers would like to encourage you to use [facility] to communicate with providers for non-urgent requests or questions.  Due to long hold times on the telephone, sending your provider a message by [facility] may be a faster and more efficient way to get a response.  Please allow 48 business hours for a response.  Please remember that this is for non-urgent requests.  _______________________________________________________  Luis Mitchell have been scheduled for an endoscopy and colonoscopy. Please follow the written instructions given to you at your visit today. Please pick up your prep supplies at the pharmacy within the next 1-3 days. If you use inhalers (even only as needed), please bring them with you on the day of your procedure.  You have been scheduled for a Barium Esophogram at [facility] (1st floor of the hospital) on 01-07-2022 at 9:30am. Please arrive 30 minutes prior to your appointment for registration. Make certain not to have anything to eat or drink 3 hours prior to your test. If you need to reschedule for any reason, please contact radiology at [phone] to do so. __________________________________________________________________ A barium swallow is an examination that concentrates on views of the esophagus. This tends to be a double contrast exam (barium and two liquids which, when combined, create a gas to distend the wall of the oesophagus) or single contrast (non-ionic  iodine based). The study is usually tailored to your symptoms so a good history is essential. Attention is paid during the study to the form, structure and configuration of the esophagus, looking for functional disorders (such as aspiration, dysphagia, achalasia, motility and reflux) EXAMINATION You may be asked to change into a gown, depending on the type of swallow being performed. A radiologist and radiographer will perform the procedure. The radiologist will advise you of the type of contrast selected for your procedure and direct you during the exam. You will be asked to stand, sit or lie in several different positions and to hold a small amount of fluid in your mouth before being asked to swallow while the imaging is performed .In some instances you may be asked to swallow barium coated marshmallows to assess the motility of a solid food bolus. The exam can be recorded as a digital or video fluoroscopy procedure. POST PROCEDURE It will take 1-2 days for the barium to pass through your system. To facilitate this, it is important, unless otherwise directed, to increase your fluids for the next 24-48hrs and to resume your normal diet.  This test typically takes about 30 minutes to perform. __________________________________________________________________________________   It was a pleasure to see you today!  Thank you for trusting me with your gastrointestinal care!

## 2021-12-25 NOTE — Progress Notes (Signed)
Patient ID: Luis Mitchell, male   DOB: 11/15/1960, 61 y.o.   MRN: [id number] HPI: [name] is a 61 year old male with a history of GERD, CAD with CHF (EF 45 to 50% as of June 2022 by echo), hypertension, dyslipidemia and obesity who is seen in consult at the request of Dr. Alroy Dust to evaluate dysphagia symptom.  He is here alone today.  He had an upper endoscopy to evaluate dysphagia symptom which was performed on 03/22/2014.  This revealed frothy secretions throughout the esophagus as well as moderate resistance to pass the LES.  Question of dysmotility was raised at that time.  Dilation was performed with a Savary dilator to 17 mm.  There was mild antral gastropathy.  Biopsies from the esophagus were normal without evidence for eosinophilic esophagitis.  Normal gastric biopsies.  At that time he reports that dysphagia symptom improved to the best of his recollection.  He reports that he has ongoing issues with swallowing.  Food simply does not transit into his stomach normally.  He reports he can always regurgitate what he has eaten and he can even do this with liquids.  He does feel foamy and frothy secretions, after drinking.  He does not have much heartburn.  At times he will have to stop eating and allow extra time for food to pass or try to push food down with fluid.  He will occasionally use Rolaids but again is not having much heartburn.  He denies nausea, vomiting, weight loss or abdominal pain.  Bowel movements are normal.  No blood in stool or melena.  He reports at times he can regurgitate solid food back up and spit it out even 30 minutes after eating.  He takes Plavix daily.  No known family history of GI tract malignancy.  No known family history of achalasia.  Past Medical History:  Diagnosis Date   Chronic combined systolic (congestive) and diastolic (congestive) heart failure (HCC)    Coronary atherosclerosis of native coronary artery    a. MI s/p BMS to [facility] 2007 with  residual 90% diagonal, 50% Cx disease. b. stress test 08/2014 without ischemia.   Dilated aortic root (HCC)    HTN (hypertension)    Hyperlipidemia    Ischemic cardiomyopathy    Myocardial infarction ([place]) 06/20/2006   Obesity    Pulmonary nodules     Past Surgical History:  Procedure Laterality Date   CORONARY STENT PLACEMENT     ESOPHAGOGASTRODUODENOSCOPY     HERNIA REPAIR     None      Outpatient Medications Prior to Visit  Medication Sig Dispense Refill   amLODipine (NORVASC) 5 MG tablet Take 1 tablet (5 mg total) by mouth daily. 180 tablet 3   cetirizine (ZYRTEC) 10 MG tablet Take 1 tablet (10 mg total) by mouth daily. 30 tablet 11   clopidogrel (PLAVIX) 75 MG tablet Take 1 tablet by mouth once daily 90 tablet 2   diphenhydrAMINE (BENADRYL) 25 MG tablet Take 1 tab as needed for allergic reactions     ENTRESTO 49-51 MG Take 1 tablet by mouth twice daily 60 tablet 9   ezetimibe (ZETIA) 10 MG tablet Take 1 tablet (10 mg total) by mouth daily. 90 tablet 3   furosemide (LASIX) 20 MG tablet TAKE 1 TABLET BY MOUTH ONCE DAILY IN THE MORNING 90 tablet 1   ipratropium (ATROVENT) 0.03 % nasal spray Place 2 sprays into both nostrils 2 (two) times daily. 30 mL 0   metoprolol succinate (TOPROL-XL)  100 MG 24 hr tablet Take 1 tablet (100 mg total) by mouth daily. 90 tablet 1   rosuvastatin (CRESTOR) 40 MG tablet Take 1 tablet by mouth once daily 90 tablet 3   mometasone (NASONEX) 50 MCG/ACT nasal spray USE TWO SPRAY(S) IN EACH NOSTRIL ONCE DAILY (Patient not taking: Reported on 12/25/2021) 17 g 2   No facility-administered medications prior to visit.    Allergies  Allergen Reactions   Aspirin Swelling   Nsaids Swelling   Tolmetin Swelling    Family History  Problem Relation Age of Onset   Heart attack Father    Colon cancer Neg Hx    Colon polyps Neg Hx    Esophageal cancer Neg Hx    Kidney disease Neg Hx    Stomach cancer Neg Hx     Social History   Tobacco Use   Smoking  status: Former    Types: Cigars   Smokeless tobacco: Never  Vaping Use   Vaping Use: Never used  Substance Use Topics   Alcohol use: Yes    Alcohol/week: 42.0 standard drinks of alcohol    Types: 42 Standard drinks or equivalent per week    Comment: Drinks 4 per day   Drug use: No    ROS: As per history of present illness, otherwise negative  BP 128/84   Pulse 63   Ht '5\' 7"'$  (1.702 m)   Wt 283 lb 9.6 oz (128.6 kg)   SpO2 98%   BMI 44.42 kg/m  Gen: awake, alert, NAD HEENT: anicteric  CV: RRR, no mrg Pulm: CTA b/l Abd: soft, obese, NT/ND, +BS throughout Ext: no c/c/e Neuro: nonfocal   RELEVANT LABS AND IMAGING: CBC    Component Value Date/Time   WBC 9.1 11/06/2020 0952   WBC 7.8 02/27/2014 1011   RBC 4.95 11/06/2020 0952   RBC 4.46 02/27/2014 1011   HGB 14.9 11/06/2020 0952   HCT 44.0 11/06/2020 0952   PLT 342 11/06/2020 0952   MCV 89 11/06/2020 0952   MCH 30.1 11/06/2020 0952   MCHC 33.9 11/06/2020 0952   MCHC 33.2 02/27/2014 1011   RDW 12.4 11/06/2020 0952   LYMPHSABS 1.2 02/27/2014 1011   MONOABS 0.7 02/27/2014 1011   EOSABS 0.2 02/27/2014 1011   BASOSABS 0.0 02/27/2014 1011    CMP     Component Value Date/Time   NA 137 01/23/2021 0808   K 4.1 01/23/2021 0808   CL 97 01/23/2021 0808   CO2 24 01/23/2021 0808   GLUCOSE 111 (H) 01/23/2021 0808   GLUCOSE 121 (H) 02/27/2014 1011   BUN 19 01/23/2021 0808   CREATININE 0.92 01/23/2021 0808   CALCIUM 9.3 01/23/2021 0808   PROT 7.0 01/23/2021 0808   ALBUMIN 4.5 01/23/2021 0808   AST 18 01/23/2021 0808   ALT 23 01/23/2021 0808   ALKPHOS 83 01/23/2021 0808   BILITOT 0.4 01/23/2021 0808   GFRNONAA 91 08/08/2019 0909   GFRAA 105 08/08/2019 0909   CT CHEST WITHOUT CONTRAST   TECHNIQUE: Multidetector CT imaging of the chest was performed following the standard protocol without IV contrast.   RADIATION DOSE REDUCTION: This exam was performed according to the departmental dose-optimization program which  includes automated exposure control, adjustment of the mA and/or kV according to patient size and/or use of iterative reconstruction technique.   COMPARISON:  CT 11/17/2020   FINDINGS: Cardiovascular: Limited evaluation without intravenous contrast. Moderate aortic atherosclerosis. No aneurysm. Coronary vascular calcification. Normal cardiac size. No pericardial effusion  Mediastinum/Nodes: Midline trachea. No thyroid mass. No suspicious lymph nodes. Fluid distended esophagus.   Lungs/Pleura: No acute airspace disease, pleural effusion or pneumothorax. Stable 7 mm right middle lobe pulmonary nodule, series 3, image 68. Stable 5 mm left lower lobe pulmonary nodule, series 3, image 66. No new pulmonary nodules. No consolidation, pleural effusion or pneumothorax   Upper Abdomen: No acute abnormality. Subcentimeter hypodense right lobe liver lesions too small to further characterize. Cyst at the dome of the right hepatic lobe, no follow-up imaging recommended   Musculoskeletal: No chest wall mass or suspicious bone lesions identified.   IMPRESSION: 1. Stable bilateral pulmonary nodules measuring up to 7 mm at the right middle lobe. Follow-up CT at 18-24 months (from CT dated 11/17/2020) is considered optional for low risk patients but is recommended for high-risk patients. 2. Fluid-filled esophagus could be secondary to dysmotility or reflux   Aortic Atherosclerosis (ICD10-I70.0).     Electronically Signed   By: Donavan Foil M.D.   On: 11/06/2021 19:37    ASSESSMENT/PLAN: 61 year old male with a history of GERD, CAD with CHF (EF 45 to 50% as of June 2022 by echo), hypertension, dyslipidemia and obesity who is seen in consult at the request of Dr. Alroy Dust to evaluate dysphagia symptom.   Dysphagia --I have a moderate to high suspicion for esophageal dysmotility and even achalasia.  Reviewing EGD from 8 years ago he had foamy secretions throughout the esophagus as well as  increased resistance at the LES during endoscopy.  He is not having much in the way of heartburn and did not have a EoE 8 years ago.  The esophagus did have fluid on recent CT scan which is consistent with dysmotility.  I recommended the following: --Barium esophagram with tablet to evaluate motility --Upper endoscopy; we reviewed the risk, benefits and alternatives and he is agreeable and wishes to proceed --See below regarding Plavix --May eventually need high-resolution manometry and if achalasia is confirmed consideration of myotomy (POEM) versus pneumatic dilation.  2.  Colon cancer screening --average risk, no prior colonoscopy or Cologuard.  Colonoscopy recommended.  We reviewed the risk, benefits and alternatives and he is agreeable and wishes to proceed --Colonoscopy in the [place]  3.  Chronic Plavix use in the setting of prior CAD with PCI -- Hold Plavix 5 days before procedure - will instruct when and how to resume after procedure. Risks and benefits of procedure including bleeding, perforation, infection, missed lesions, medication reactions and possible hospitalization or surgery if complications occur explained. Additional rare but real risk of cardiovascular event such as heart attack or ischemia/infarct of other organs off Plavix explained and need to seek urgent help if this occurs. Will communicate by phone or EMR with patient's prescribing provider that to confirm holding Plavix is reasonable in this case.      KG:MWNUUVOZ, L.dean, Md [address] [place],  [place] [address]

## 2021-12-26 NOTE — Telephone Encounter (Signed)
Will route back to the requesting surgeon's office to make them aware that pt needs to take Aspirin 81 mg daily while he is holding is Plavix for the upcoming procedure.  We have left a message for pt to call back, but have had no response.  Surgeon's office may relay message as well.  Thank you!

## 2021-12-30 NOTE — Telephone Encounter (Signed)
Left message to return call 

## 2021-12-30 NOTE — Telephone Encounter (Signed)
Patient has been notified to hold Plavix for 5 days. He states he cannot take ASA '81mg'$  as requested. Pt states he is allergic to aspirin.

## 2022-01-01 ENCOUNTER — Other Ambulatory Visit: Payer: Self-pay | Admitting: Physician Assistant

## 2022-01-01 ENCOUNTER — Other Ambulatory Visit: Payer: Self-pay | Admitting: Cardiovascular Disease

## 2022-01-07 ENCOUNTER — Ambulatory Visit (HOSPITAL_COMMUNITY)
Admission: RE | Admit: 2022-01-07 | Discharge: 2022-01-07 | Disposition: A | Discharge status: Home / Self Care | Payer: BC Managed Care – PPO | Source: Ambulatory Visit | Attending: Internal Medicine | Admitting: Internal Medicine

## 2022-01-07 DIAGNOSIS — R933 Abnormal findings on diagnostic imaging of other parts of digestive tract: Secondary | ICD-10-CM | POA: Insufficient documentation

## 2022-01-07 DIAGNOSIS — Z7902 Long term (current) use of antithrombotics/antiplatelets: Secondary | ICD-10-CM | POA: Diagnosis not present

## 2022-01-07 DIAGNOSIS — Z1211 Encounter for screening for malignant neoplasm of colon: Secondary | ICD-10-CM | POA: Diagnosis not present

## 2022-01-07 DIAGNOSIS — R1310 Dysphagia, unspecified: Secondary | ICD-10-CM | POA: Diagnosis not present

## 2022-01-22 ENCOUNTER — Telehealth: Payer: Self-pay | Admitting: Internal Medicine

## 2022-01-22 NOTE — Telephone Encounter (Signed)
Left message for patient that I have contacted pharmacy with coupon for Plenvu which should make it much more affordable. I asked that he call back if the new pricing was still an issue for him.

## 2022-02-05 ENCOUNTER — Encounter: Payer: Self-pay | Admitting: Internal Medicine

## 2022-02-06 ENCOUNTER — Encounter: Payer: Self-pay | Admitting: Certified Registered Nurse Anesthetist

## 2022-02-12 ENCOUNTER — Ambulatory Visit (AMBULATORY_SURGERY_CENTER): Payer: BC Managed Care – PPO | Admitting: Internal Medicine

## 2022-02-12 ENCOUNTER — Telehealth: Payer: Self-pay

## 2022-02-12 ENCOUNTER — Other Ambulatory Visit: Payer: Self-pay

## 2022-02-12 ENCOUNTER — Encounter: Payer: Self-pay | Admitting: Internal Medicine

## 2022-02-12 VITALS — BP 106/59 | HR 60 | Temp 98.4°F | Resp 14 | Ht 67.0 in | Wt 283.0 lb

## 2022-02-12 DIAGNOSIS — R1319 Other dysphagia: Secondary | ICD-10-CM | POA: Diagnosis not present

## 2022-02-12 DIAGNOSIS — K2970 Gastritis, unspecified, without bleeding: Secondary | ICD-10-CM

## 2022-02-12 DIAGNOSIS — K224 Dyskinesia of esophagus: Secondary | ICD-10-CM

## 2022-02-12 DIAGNOSIS — K319 Disease of stomach and duodenum, unspecified: Secondary | ICD-10-CM | POA: Diagnosis not present

## 2022-02-12 DIAGNOSIS — Z1211 Encounter for screening for malignant neoplasm of colon: Secondary | ICD-10-CM | POA: Diagnosis not present

## 2022-02-12 DIAGNOSIS — D123 Benign neoplasm of transverse colon: Secondary | ICD-10-CM

## 2022-02-12 DIAGNOSIS — B3781 Candidal esophagitis: Secondary | ICD-10-CM | POA: Diagnosis not present

## 2022-02-12 DIAGNOSIS — R933 Abnormal findings on diagnostic imaging of other parts of digestive tract: Secondary | ICD-10-CM | POA: Diagnosis not present

## 2022-02-12 DIAGNOSIS — K2990 Gastroduodenitis, unspecified, without bleeding: Secondary | ICD-10-CM | POA: Diagnosis not present

## 2022-02-12 DIAGNOSIS — D125 Benign neoplasm of sigmoid colon: Secondary | ICD-10-CM

## 2022-02-12 DIAGNOSIS — D124 Benign neoplasm of descending colon: Secondary | ICD-10-CM

## 2022-02-12 MED ORDER — SODIUM CHLORIDE 0.9 % IV SOLN: 500.0000 mL | Freq: Once | INTRAVENOUS | Status: DC

## 2022-02-12 NOTE — Op Note (Signed)
[facility] Patient Name: Makale Pindell Procedure Date: 02/12/2022 11:29 AM MRN: [id number] Endoscopist: Jerene Bears , MD Age: 61 Referring MD:  Date of Birth: 07/26/1960 Gender: Male Account #: [id number] Procedure:                Upper GI endoscopy Indications:              Dysphagia, Abnormal cine-esophagram Medicines:                Monitored Anesthesia Care Procedure:                Pre-Anesthesia Assessment:                           - Prior to the procedure, a History and Physical                            was performed, and patient medications and                            allergies were reviewed. The patient's tolerance of                            previous anesthesia was also reviewed. The risks                            and benefits of the procedure and the sedation                            options and risks were discussed with the patient.                            All questions were answered, and informed consent                            was obtained. Prior Anticoagulants: The patient has                            taken Plavix (clopidogrel), last dose was 5 days                            prior to procedure. ASA Grade Assessment: III - A                            patient with severe systemic disease. After                            reviewing the risks and benefits, the patient was                            deemed in satisfactory condition to undergo the                            procedure.  After obtaining informed consent, the endoscope was                            passed under direct vision. Throughout the                            procedure, the patient's blood pressure, pulse, and                            oxygen saturations were monitored continuously. The                            Endoscope was introduced through the mouth, and                            advanced to the second part of duodenum. The upper                             GI endoscopy was accomplished without difficulty.                            The patient tolerated the procedure well. Scope In: Scope Out: 11:53:47 AM Findings:                 The lumen of the upper third of the esophagus and                            middle third of the esophagus was mildly dilated.                            Small white plaques in the upper esophagus                            consistent with mild Candida.                           A hypertonic lower esophageal sphincter was found.                            Highly suggestive of achalasia. A TTS dilator was                            passed through the scope. Dilation with an 18-19-20                            mm balloon dilator was performed to 19 mm. The                            dilation site was examined and showed mild mucosal                            disruption.  The cardia and gastric fundus were normal on                            retroflexion.                           Moderate inflammation characterized by congestion                            (edema), erosions, erythema and granularity was                            found in the gastric antrum. Biopsies were taken                            with a cold forceps for histology and Helicobacter                            pylori testing.                           Mild inflammation characterized by erythema was                            found in the duodenal bulb.                           The second portion of the duodenum was normal. Complications:            No immediate complications. Estimated Blood Loss:     Estimated blood loss was minimal. Impression:               - Dilation in the upper third of the esophagus and                            in the middle third of the esophagus. Mild proximal                            esophagal Candida (stasis related).                           - Hypertonic lower  esophageal sphincter. Dilated to                            19 mm with balloon. High suspicion of achalasia.                           - Gastritis. Biopsied.                           - Duodenitis.                           - Normal second portion of the duodenum. Recommendation:           - Patient has a contact number available for  emergencies. The signs and symptoms of potential                            delayed complications were discussed with the                            patient. Return to normal activities tomorrow.                            Written discharge instructions were provided to the                            patient.                           - Resume previous diet.                           - Continue present medications.                           - Await pathology results.                           - Proceed with high resolution esophageal manometry                            and if achalasia is confirmed then POEM versus                            Heller myotomy. Jerene Bears, MD 02/12/2022 12:22:25 PM This report has been signed electronically.

## 2022-02-12 NOTE — Progress Notes (Signed)
1145 Robinul 0.1 mg IV given due large amount of secretions upon assessment.  MD made aware, vss 

## 2022-02-12 NOTE — Progress Notes (Signed)
Report given to PACU, vss 

## 2022-02-12 NOTE — Patient Instructions (Addendum)
Handout on polyps, diverticulosis, and gastritis given to patient. Await pathology results. Resume previous diet and continue present medications - resume Plavix at previous dose tomorrow 02/13/2022 Repeat colonoscopy for surveillance will be determined based off of pathology results. Proceed with high resolution esophageal manometry and if achalasia is confirmed then POEM versus Heller myotomy.  YOU HAD AN ENDOSCOPIC PROCEDURE TODAY AT [facility]:   Refer to the procedure report that was given to you for any specific questions about what was found during the examination.  If the procedure report does not answer your questions, please call your gastroenterologist to clarify.  If you requested that your care partner not be given the details of your procedure findings, then the procedure report has been included in a sealed envelope for you to review at your convenience later.  YOU SHOULD EXPECT: Some feelings of bloating in the abdomen. Passage of more gas than usual.  Walking can help get rid of the air that was put into your GI tract during the procedure and reduce the bloating. If you had a lower endoscopy (such as a colonoscopy or flexible sigmoidoscopy) you may notice spotting of blood in your stool or on the toilet paper. If you underwent a bowel prep for your procedure, you may not have a normal bowel movement for a few days.  Please Note:  You might notice some irritation and congestion in your nose or some drainage.  This is from the oxygen used during your procedure.  There is no need for concern and it should clear up in a day or so.  SYMPTOMS TO REPORT IMMEDIATELY:  Following lower endoscopy (colonoscopy or flexible sigmoidoscopy):  Excessive amounts of blood in the stool  Significant tenderness or worsening of abdominal pains  Swelling of the abdomen that is new, acute  Fever of 100F or higher  Following upper endoscopy (EGD)  Vomiting of blood or coffee ground  material  New chest pain or pain under the shoulder blades  Painful or persistently difficult swallowing  New shortness of breath  Fever of 100F or higher  Black, tarry-looking stools  For urgent or emergent issues, a gastroenterologist can be reached at any hour by calling [phone]. Do not use MyChart messaging for urgent concerns.    DIET:  We do recommend a small meal at first, but then you may proceed to your regular diet.  Drink plenty of fluids but you should avoid alcoholic beverages for 24 hours.  ACTIVITY:  You should plan to take it easy for the rest of today and you should NOT DRIVE or use heavy machinery until tomorrow (because of the sedation medicines used during the test).    FOLLOW UP: Our staff will call the number listed on your records the next business day following your procedure.  We will call around 7:15- 8:00 am to check on you and address any questions or concerns that you may have regarding the information given to you following your procedure. If we do not reach you, we will leave a message.  If you develop any symptoms (ie: fever, flu-like symptoms, shortness of breath, cough etc.) before then, please call [phone].  If you test positive for Covid 19 in the 2 weeks post procedure, please call and report this information to [place].    If any biopsies were taken you will be contacted by phone or by letter within the next 1-3 weeks.  Please call us at [phone] if you have not heard  about the biopsies in 3 weeks.    SIGNATURES/CONFIDENTIALITY: You and/or your care partner have signed paperwork which will be entered into your electronic medical record.  These signatures attest to the fact that that the information above on your After Visit Summary has been reviewed and is understood.  Full responsibility of the confidentiality of this discharge information lies with you and/or your care-partner.

## 2022-02-12 NOTE — Progress Notes (Signed)
GASTROENTEROLOGY PROCEDURE H&P NOTE   Primary Care Physician: Alroy Dust, L.Marlou Sa, MD    Reason for Procedure:  Dysphagia, abnormal barium esophagram, colon cancer screening  Plan:    EGD and colonoscopy  Patient is appropriate for endoscopic procedure(s) in the ambulatory ([place]) setting.  The nature of the procedure, as well as the risks, benefits, and alternatives were carefully and thoroughly reviewed with the patient. Ample time for discussion and questions allowed. The patient understood, was satisfied, and agreed to proceed.     HPI: Luis Mitchell is a 61 y.o. male who presents for EGD and colonoscopy.  Medical history as below.  Tolerated the prep.  No recent chest pain or shortness of breath.  No abdominal pain today.  Plavix on hold x5 days.  Past Medical History:  Diagnosis Date   Chronic combined systolic (congestive) and diastolic (congestive) heart failure (HCC)    Coronary atherosclerosis of native coronary artery    a. MI s/p BMS to [facility] 2007 with residual 90% diagonal, 50% Cx disease. b. stress test 08/2014 without ischemia.   Dilated aortic root (HCC)    HTN (hypertension)    Hyperlipidemia    Ischemic cardiomyopathy    Myocardial infarction ([place]) 06/20/2006   Obesity    Pulmonary nodules     Past Surgical History:  Procedure Laterality Date   CORONARY STENT PLACEMENT     ESOPHAGOGASTRODUODENOSCOPY     HERNIA REPAIR     None      Prior to Admission medications   Medication Sig Start Date End Date Taking? Authorizing Provider  amLODipine (NORVASC) 5 MG tablet Take 1 tablet by mouth once daily 01/01/22  Yes Burnell Blanks, MD  ENTRESTO 49-51 MG Take 1 tablet by mouth twice daily 03/22/21  Yes Burnell Blanks, MD  ezetimibe (ZETIA) 10 MG tablet Take 1 tablet (10 mg total) by mouth daily. 12/10/21  Yes Burnell Blanks, MD  furosemide (LASIX) 20 MG tablet TAKE 1 TABLET BY MOUTH ONCE DAILY IN THE MORNING 11/20/21  Yes Burnell Blanks, MD  metoprolol succinate (TOPROL-XL) 100 MG 24 hr tablet Take 1 tablet (100 mg total) by mouth daily. 11/20/21  Yes Burnell Blanks, MD  mometasone (NASONEX) 50 MCG/ACT nasal spray USE TWO SPRAY(S) IN EACH NOSTRIL ONCE DAILY 08/22/16  Yes English, Stephanie D, PA  rosuvastatin (CRESTOR) 40 MG tablet Take 1 tablet by mouth once daily 12/24/21  Yes McAlhany, Annita Brod, MD  cetirizine (ZYRTEC) 10 MG tablet Take 1 tablet (10 mg total) by mouth daily. 08/07/17   Jaynee Eagles, PA-C  clopidogrel (PLAVIX) 75 MG tablet Take 1 tablet by mouth once daily 01/01/22   Burnell Blanks, MD  diphenhydrAMINE (BENADRYL) 25 MG tablet Take 1 tab as needed for allergic reactions    [provider]  ipratropium (ATROVENT) 0.03 % nasal spray Place 2 sprays into both nostrils 2 (two) times daily. 08/20/15   Posey Boyer, MD    Current Outpatient Medications  Medication Sig Dispense Refill   amLODipine (NORVASC) 5 MG tablet Take 1 tablet by mouth once daily 90 tablet 3   ENTRESTO 49-51 MG Take 1 tablet by mouth twice daily 60 tablet 9   ezetimibe (ZETIA) 10 MG tablet Take 1 tablet (10 mg total) by mouth daily. 90 tablet 3   furosemide (LASIX) 20 MG tablet TAKE 1 TABLET BY MOUTH ONCE DAILY IN THE MORNING 90 tablet 1   metoprolol succinate (TOPROL-XL) 100 MG 24 hr tablet Take  1 tablet (100 mg total) by mouth daily. 90 tablet 1   mometasone (NASONEX) 50 MCG/ACT nasal spray USE TWO SPRAY(S) IN EACH NOSTRIL ONCE DAILY 17 g 2   rosuvastatin (CRESTOR) 40 MG tablet Take 1 tablet by mouth once daily 90 tablet 3   cetirizine (ZYRTEC) 10 MG tablet Take 1 tablet (10 mg total) by mouth daily. 30 tablet 11   clopidogrel (PLAVIX) 75 MG tablet Take 1 tablet by mouth once daily 90 tablet 3   diphenhydrAMINE (BENADRYL) 25 MG tablet Take 1 tab as needed for allergic reactions     ipratropium (ATROVENT) 0.03 % nasal spray Place 2 sprays into both nostrils 2 (two) times daily. 30 mL 0   Current  Facility-Administered Medications  Medication Dose Route Frequency Provider Last Rate Last Admin   0.9 %  sodium chloride infusion  500 mL Intravenous Once Cheney Gosch, Lajuan Lines, MD        Allergies as of 02/12/2022 - Review Complete 02/12/2022  Allergen Reaction Noted   Aspirin Swelling 10/23/2009   Nsaids Swelling 10/23/2009   Tolmetin Swelling 10/23/2009    Family History  Problem Relation Age of Onset   Heart attack Father    Colon cancer Neg Hx    Colon polyps Neg Hx    Esophageal cancer Neg Hx    Kidney disease Neg Hx    Stomach cancer Neg Hx     Social History   Socioeconomic History   Marital status: Married    Spouse name: Not on file   Number of children: 2   Years of education: Not on file   Highest education level: Not on file  Occupational History   Occupation: Carpenter  Tobacco Use   Smoking status: Former    Types: Cigars   Smokeless tobacco: Never  Vaping Use   Vaping Use: Never used  Substance and Sexual Activity   Alcohol use: Yes    Alcohol/week: 42.0 standard drinks of alcohol    Types: 42 Standard drinks or equivalent per week    Comment: Drinks 4 per day   Drug use: No   Sexual activity: Not on file  Other Topics Concern   Not on file  Social History Narrative   Not on file   Social Determinants of Health   Financial Resource Strain: Not on file  Food Insecurity: Not on file  Transportation Needs: Not on file  Physical Activity: Not on file  Stress: Not on file  Social Connections: Not on file  Intimate Partner Violence: Not on file    Physical Exam: Vital signs in last 24 hours: '@BP'$  138/83   Pulse 77   Temp 98.4 F (36.9 C)   Ht '5\' 7"'$  (1.702 m)   Wt 283 lb (128.4 kg)   SpO2 98%   BMI 44.32 kg/m  GEN: NAD EYE: Sclerae anicteric ENT: MMM CV: Non-tachycardic Pulm: CTA b/l GI: Soft, NT/ND NEURO:  Alert & Oriented x 3   Luis Jarred, MD [facility]  02/12/2022 11:21 AM

## 2022-02-12 NOTE — Progress Notes (Signed)
1207 Ephedrine 10 mg given IV due to low BP, MD updated.

## 2022-02-12 NOTE — Telephone Encounter (Unsigned)
Pt scheduled for EM at [facility] 03/05/22 at 2pm. Pt to arrive there at 1:30pm. Left message for pt to call back.  Spoke with pt and he is aware of appt, instructions mailed and sent via mychart. Amb ref in epic.

## 2022-02-12 NOTE — Progress Notes (Signed)
Called to room to assist during endoscopic procedure.  Patient ID and intended procedure confirmed with present staff. Received instructions for my participation in the procedure from the performing physician.  

## 2022-02-12 NOTE — Op Note (Signed)
[place] Patient Name: Luis Mitchell Procedure Date: 02/12/2022 11:29 AM MRN: [id number] Endoscopist: Jerene Bears , MD Age: 61 Referring MD:  Date of Birth: [birth year] Gender: Male Account #: [id number] Procedure:                Colonoscopy Indications:              Screening for colorectal malignant neoplasm, This                            is the patient's first colonoscopy Medicines:                Monitored Anesthesia Care Procedure:                Pre-Anesthesia Assessment:                           - Prior to the procedure, a History and Physical                            was performed, and patient medications and                            allergies were reviewed. The patient's tolerance of                            previous anesthesia was also reviewed. The risks                            and benefits of the procedure and the sedation                            options and risks were discussed with the patient.                            All questions were answered, and informed consent                            was obtained. Prior Anticoagulants: The patient has                            taken Plavix (clopidogrel), last dose was 5 days                            prior to procedure. ASA Grade Assessment: III - A                            patient with severe systemic disease. After                            reviewing the risks and benefits, the patient was                            deemed in satisfactory condition to undergo the  procedure.                           After obtaining informed consent, the colonoscope                            was passed under direct vision. Throughout the                            procedure, the patient's blood pressure, pulse, and                            oxygen saturations were monitored continuously. The                            Olympus CF-HQ190L (Serial# [id number]) Colonoscope was                             introduced through the anus and advanced to the                            cecum, identified by appendiceal orifice and                            ileocecal valve. The colonoscopy was performed                            without difficulty. The patient tolerated the                            procedure well. The quality of the bowel                            preparation was good. The ileocecal valve,                            appendiceal orifice, and rectum were photographed. Scope In: 11:56:14 AM Scope Out: 12:12:07 PM Scope Withdrawal Time: 0 hours 12 minutes 51 seconds  Total Procedure Duration: 0 hours 15 minutes 53 seconds  Findings:                 The digital rectal exam was normal.                           A 4 mm polyp was found in the hepatic flexure. The                            polyp was sessile. The polyp was removed with a                            cold snare. Resection and retrieval were complete.                           A 4 mm polyp was found in the transverse colon. The  polyp was sessile. The polyp was removed with a                            cold snare. Resection and retrieval were complete.                           A 7 mm polyp was found in the descending colon. The                            polyp was sessile. The polyp was removed with a                            cold snare. Resection and retrieval were complete.                           A 5 mm polyp was found in the sigmoid colon. The                            polyp was sessile. The polyp was removed with a                            cold snare. Resection and retrieval were complete.                           Multiple small and large-mouthed diverticula were                            found in the sigmoid colon and descending colon.                           The retroflexed view of the distal rectum and anal                            verge was normal and showed no  anal or rectal                            abnormalities. Complications:            No immediate complications. Estimated Blood Loss:     Estimated blood loss was minimal. Impression:               - One 4 mm polyp at the hepatic flexure, removed                            with a cold snare. Resected and retrieved.                           - One 4 mm polyp in the transverse colon, removed                            with a cold snare. Resected and retrieved.                           -  One 7 mm polyp in the descending colon, removed                            with a cold snare. Resected and retrieved.                           - One 5 mm polyp in the sigmoid colon, removed with                            a cold snare. Resected and retrieved.                           - Diverticulosis in the sigmoid colon and in the                            descending colon.                           - The distal rectum and anal verge are normal on                            retroflexion view. Recommendation:           - Patient has a contact number available for                            emergencies. The signs and symptoms of potential                            delayed complications were discussed with the                            patient. Return to normal activities tomorrow.                            Written discharge instructions were provided to the                            patient.                           - Resume previous diet.                           - Continue present medications.                           - Resume Plavix (clopidogrel) at prior dose                            tomorrow. Refer to managing physician for further                            adjustment of therapy.                           -  Await pathology results.                           - Repeat colonoscopy is recommended. The                            colonoscopy date will be determined after pathology                             results from today's exam become available for                            review. Jerene Bears, MD 02/12/2022 12:24:44 PM This report has been signed electronically.

## 2022-02-12 NOTE — Progress Notes (Signed)
1130 Robinul 0.1 mg IV given due large amount of secretions upon assessment.  MD made aware, vss 

## 2022-02-13 ENCOUNTER — Telehealth: Payer: Self-pay

## 2022-02-13 ENCOUNTER — Telehealth: Payer: Self-pay | Admitting: Internal Medicine

## 2022-02-13 NOTE — Telephone Encounter (Signed)
  Follow up Call-     02/12/2022   10:45 AM  Call back number  Post procedure Call Back phone  # [phone]  Permission to leave phone message Yes     Patient questions:  Do you have a fever, pain , or abdominal swelling? No. Pain Score  0 *  Have you tolerated food without any problems? Yes.    Have you been able to return to your normal activities? Yes.    Do you have any questions about your discharge instructions: Diet   No. Medications  No. Follow up visit  No.  Do you have questions or concerns about your Care? No.  Actions: * If pain score is 4 or above: No action needed, pain <4.

## 2022-02-13 NOTE — Telephone Encounter (Signed)
See additional note. 

## 2022-02-13 NOTE — Telephone Encounter (Signed)
Inbound call from patient staring he is returning a call from yesterday regarding upcoming appointment.Please give a call back to further advise.  Thank you

## 2022-02-17 ENCOUNTER — Encounter: Payer: Self-pay | Admitting: Internal Medicine

## 2022-02-25 ENCOUNTER — Encounter (HOSPITAL_COMMUNITY): Payer: Self-pay | Admitting: Internal Medicine

## 2022-02-25 NOTE — Progress Notes (Signed)
Attempted to obtain medical history via telephone, unable to reach at this time. HIPAA compliant voicemail message left requesting return call to pre surgical testing department. 

## 2022-03-05 ENCOUNTER — Ambulatory Visit (HOSPITAL_COMMUNITY)
Admission: RE | Admit: 2022-03-05 | Discharge: 2022-03-05 | Disposition: A | Discharge status: Home / Self Care | Payer: BC Managed Care – PPO | Attending: Internal Medicine | Admitting: Internal Medicine

## 2022-03-05 ENCOUNTER — Encounter (HOSPITAL_COMMUNITY)
Admission: RE | Disposition: A | Discharge status: Home / Self Care | Payer: Self-pay | Source: Home / Self Care | Attending: Internal Medicine

## 2022-03-05 DIAGNOSIS — K220 Achalasia of cardia: Secondary | ICD-10-CM | POA: Diagnosis not present

## 2022-03-05 DIAGNOSIS — R1319 Other dysphagia: Secondary | ICD-10-CM

## 2022-03-05 DIAGNOSIS — R1310 Dysphagia, unspecified: Secondary | ICD-10-CM | POA: Insufficient documentation

## 2022-03-05 DIAGNOSIS — K222 Esophageal obstruction: Secondary | ICD-10-CM | POA: Insufficient documentation

## 2022-03-05 HISTORY — PX: ESOPHAGEAL MANOMETRY: SHX5429

## 2022-03-05 SURGERY — MANOMETRY, ESOPHAGUS
Anesthesia: Choice

## 2022-03-05 MED ORDER — LIDOCAINE VISCOUS HCL 2 % MT SOLN
OROMUCOSAL | Status: AC
  Filled 2022-03-05: qty 15

## 2022-03-05 SURGICAL SUPPLY — 2 items
FACESHIELD LNG OPTICON STERILE (SAFETY) IMPLANT
GLOVE BIO SURGEON STRL SZ8 (GLOVE) ×2 IMPLANT

## 2022-03-05 NOTE — Progress Notes (Signed)
Esophageal Manometry done per protocol. Patient tolerated well without distress or complication.  When removing probe from patient , the metal sensor started to become stuck in patients nose. Tried to push probe back down slightly and not coming out easily from nose. Dr. Watt Climes in to room to assist. Patient tolerating fine. Dr. Watt Climes was able to remove probe from nose at a slightly different angle and probe came out .Small amount of blood on probe from nose. Patients nose only bleed a small amount and stopped. Patient denied and discomfort or issues after probe out. Patient discharged as normal. No issues after probe was out with help from Dr. Watt Climes.

## 2022-03-07 ENCOUNTER — Encounter (HOSPITAL_COMMUNITY): Payer: Self-pay | Admitting: Internal Medicine

## 2022-03-28 DIAGNOSIS — R1319 Other dysphagia: Secondary | ICD-10-CM

## 2022-03-28 DIAGNOSIS — K220 Achalasia of cardia: Secondary | ICD-10-CM

## 2022-03-31 ENCOUNTER — Telehealth: Payer: Self-pay | Admitting: Internal Medicine

## 2022-03-31 NOTE — Telephone Encounter (Signed)
Inbound call from patient requesting results from Esophageal Manometry done on 9/6. Please advise.

## 2022-03-31 NOTE — Telephone Encounter (Signed)
I have not seen this formal report but I did speak to Dr. Silverio Decamp who has reviewed the study The study confirms achalasia He should be referred to Dr. Claude Manges at [facility] to consider POEM for endoscopic management of this condition -- this will significantly improving swallowing difficulties

## 2022-03-31 NOTE — Telephone Encounter (Signed)
Pt calling for EM results. Please advise.

## 2022-04-01 NOTE — Telephone Encounter (Signed)
Left message for pt to call back  °

## 2022-04-03 NOTE — Telephone Encounter (Signed)
Pt aware of results and recommendations per Dr. Hilarie Fredrickson. Referral faxed to [facility] for referral to see Dr. Leroy Sea for possible poem.  Pt has a consult appt at [facility] 07/16/22

## 2022-05-10 ENCOUNTER — Other Ambulatory Visit: Payer: Self-pay | Admitting: Cardiovascular Disease

## 2022-06-04 DIAGNOSIS — Z23 Encounter for immunization: Secondary | ICD-10-CM | POA: Diagnosis not present

## 2022-06-04 DIAGNOSIS — K220 Achalasia of cardia: Secondary | ICD-10-CM | POA: Diagnosis not present

## 2022-06-12 ENCOUNTER — Other Ambulatory Visit: Payer: Self-pay | Admitting: Cardiovascular Disease

## 2022-07-31 DIAGNOSIS — Z01818 Encounter for other preprocedural examination: Secondary | ICD-10-CM | POA: Diagnosis not present

## 2022-07-31 DIAGNOSIS — I5040 Unspecified combined systolic (congestive) and diastolic (congestive) heart failure: Secondary | ICD-10-CM | POA: Diagnosis not present

## 2022-07-31 DIAGNOSIS — E7849 Other hyperlipidemia: Secondary | ICD-10-CM | POA: Diagnosis not present

## 2022-07-31 DIAGNOSIS — I255 Ischemic cardiomyopathy: Secondary | ICD-10-CM | POA: Diagnosis not present

## 2022-07-31 DIAGNOSIS — R1110 Vomiting, unspecified: Secondary | ICD-10-CM | POA: Diagnosis not present

## 2022-07-31 DIAGNOSIS — Z6841 Body Mass Index (BMI) 40.0 and over, adult: Secondary | ICD-10-CM | POA: Diagnosis not present

## 2022-07-31 DIAGNOSIS — R1319 Other dysphagia: Secondary | ICD-10-CM | POA: Diagnosis not present

## 2022-07-31 DIAGNOSIS — I2510 Atherosclerotic heart disease of native coronary artery without angina pectoris: Secondary | ICD-10-CM | POA: Diagnosis not present

## 2022-07-31 DIAGNOSIS — K220 Achalasia of cardia: Secondary | ICD-10-CM | POA: Diagnosis not present

## 2022-07-31 DIAGNOSIS — I5042 Chronic combined systolic (congestive) and diastolic (congestive) heart failure: Secondary | ICD-10-CM | POA: Diagnosis not present

## 2022-07-31 DIAGNOSIS — I10 Essential (primary) hypertension: Secondary | ICD-10-CM | POA: Diagnosis not present

## 2022-08-14 DIAGNOSIS — R1310 Dysphagia, unspecified: Secondary | ICD-10-CM | POA: Diagnosis not present

## 2022-08-14 DIAGNOSIS — I10 Essential (primary) hypertension: Secondary | ICD-10-CM | POA: Diagnosis not present

## 2022-08-14 DIAGNOSIS — Z7902 Long term (current) use of antithrombotics/antiplatelets: Secondary | ICD-10-CM | POA: Diagnosis not present

## 2022-08-14 DIAGNOSIS — I5042 Chronic combined systolic (congestive) and diastolic (congestive) heart failure: Secondary | ICD-10-CM | POA: Diagnosis not present

## 2022-08-14 DIAGNOSIS — I2510 Atherosclerotic heart disease of native coronary artery without angina pectoris: Secondary | ICD-10-CM | POA: Diagnosis not present

## 2022-08-14 DIAGNOSIS — Z79899 Other long term (current) drug therapy: Secondary | ICD-10-CM | POA: Diagnosis not present

## 2022-08-14 DIAGNOSIS — K2289 Other specified disease of esophagus: Secondary | ICD-10-CM | POA: Diagnosis not present

## 2022-08-14 DIAGNOSIS — K220 Achalasia of cardia: Secondary | ICD-10-CM | POA: Diagnosis not present

## 2022-08-14 DIAGNOSIS — I255 Ischemic cardiomyopathy: Secondary | ICD-10-CM | POA: Diagnosis not present

## 2022-08-14 DIAGNOSIS — Z6841 Body Mass Index (BMI) 40.0 and over, adult: Secondary | ICD-10-CM | POA: Diagnosis not present

## 2022-08-14 DIAGNOSIS — I110 Hypertensive heart disease with heart failure: Secondary | ICD-10-CM | POA: Diagnosis not present

## 2022-09-06 ENCOUNTER — Other Ambulatory Visit: Payer: Self-pay | Admitting: Cardiovascular Disease

## 2022-10-01 DIAGNOSIS — K220 Achalasia of cardia: Secondary | ICD-10-CM | POA: Diagnosis not present

## 2022-11-06 ENCOUNTER — Other Ambulatory Visit: Payer: Self-pay | Admitting: Cardiovascular Disease

## 2022-12-06 ENCOUNTER — Other Ambulatory Visit: Payer: Self-pay | Admitting: Cardiovascular Disease

## 2022-12-07 ENCOUNTER — Other Ambulatory Visit: Payer: Self-pay | Admitting: Cardiovascular Disease

## 2022-12-08 ENCOUNTER — Other Ambulatory Visit: Payer: Self-pay | Admitting: Cardiovascular Disease

## 2022-12-28 ENCOUNTER — Other Ambulatory Visit: Payer: Self-pay | Admitting: Cardiovascular Disease

## 2022-12-29 ENCOUNTER — Other Ambulatory Visit: Payer: Self-pay | Admitting: Cardiovascular Disease

## 2022-12-31 ENCOUNTER — Other Ambulatory Visit: Payer: Self-pay | Admitting: Cardiovascular Disease

## 2023-01-22 NOTE — Progress Notes (Addendum)
Cardiology Office Note:  .   Date:  01/30/2023  ID:  Luis Mitchell, DOB Aug 18, 1960, MRN [id number] PCP: Asencion Gowda.[name], MD  [facility] Providers Cardiologist:  Verne Carrow, MD    Patient Profile: .      PMH: CAD Cath 2007 >> 90% diagonal, 50% circumflex, 100% mRCA with BMS (2.75 x 16 mm Liberte) to mRCA >> EF 50% Nuclear stress test 2016 no ischemia Chronic combined CHF/ICM Echo 05/12/2017 LVEF 40-45%, G2DD, mildly dilated aortic root, biatrial enlargement                                                                                Echo 2022 LVEF 45 to 50%, G1DD, inferior wall hypokinesis, prior inferior MI, mild dilatation of aortic root 40 mm >> started on Entresto Hypertension Dilated aortic root Mild dilatation of aortic root measuring 40 mm on echo 12/06/2020 Obesity  Hyperlipidemia  He established care with Dr. Riley Kill and had cardiac cath June 21, 2006 s/p acute MI.  He has maintained consistent follow-up.   Last cardiology clinic visit was 11/27/2021 with Dr. Clifton James at which time he was doing well from a cardiac perspective and no changes were made to medical therapy. A one year follow-up was recommended.       History of Present Illness: .   Luis Mitchell is a pleasant 62 y.o. male who is here today for follow-up. 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 Feels fatigued at times, for example when sweeping his side walk yesterday.  Cannot specify timeframe when fatigue began, but at least over the last year. Had esophageal surgery 02/2022, is feeling better, no longer waking up feeling like he was choking. Push mows 4 acres about every 5-6 days. No regular exercise routine.  He continues to have brisk urine output. Occasional LE edema, no orthopnea, PND, dyspnea, palpitations, presyncope, syncope. Lengthy discussion about healthy diet and importance of weight loss.   ROS: See HPI       Studies Reviewed: .          Risk Assessment/Calculations:             Physical Exam:   VS:  BP 130/80   Pulse 61   Ht 5\' 7"  (1.702 m)   Wt 282 lb 3.2 oz (128 kg)   SpO2 99%   BMI 44.20 kg/m    Wt Readings from Last 3 Encounters:  01/30/23 282 lb 3.2 oz (128 kg)  02/12/22 283 lb (128.4 kg)  12/25/21 283 lb 9.6 oz (128.6 kg)    GEN: Obese, well developed in no acute distress NECK: No JVD; No carotid bruits CARDIAC: RRR, no murmurs, rubs, gallops RESPIRATORY:  Clear to auscultation without rales, wheezing or rhonchi  ABDOMEN: Soft, non-tender, protuberant EXTREMITIES:  No edema; No deformity     ASSESSMENT AND PLAN: .    CAD with ? angina: S/p BMS to RCA 2007, additional 90% in diagonal and 50% stenosis in Cx. Low risk myoview 2016.  He reports increased fatigue with activity, unable to specify time frame in which symptom has intensified. 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 We will get Lexiscan Myoview to evaluate for worsening ischemia.  He denies chest pain. Bleeds easily with scratches and cuts.  No acute blood loss. Continue Entresto, rosuvastatin, metoprolol, ezetimibe, clopidogrel, and amlodipine. Encouraged weight loss and heart healthy, mostly plant based, high fiber and protein diet. Encouraged 150 minutes moderate intensity exercise each week. ADDENDUM: He had abnormal lexiscan myoview on 02/03/23 and is recommended to undergo cardiac catheterization.  Risks and benefits of cardiac catheterization were discussed and he agrees to proceed.   Chronic combined CHF/ICM: LVEF 45-50%, inferior/inferoseptal HK, mild LVH, G1DD on echo 12/06/20. Reports "fatigue, I just give out" but does not specifically describe dyspnea on exertion. Volume status is difficult to assess due to body habitus. Occasional mild bilateral LE edema. No orthopnea, PND.  BP has improved on Entresto.  Continue Entresto, metoprolol. We will update lab work to evaluate renal function  today.   Hyperlipidemia: LDL 69 on 01/23/21. We will recheck today. Continue rosuvastatin, ezetimibe.   Hypertension: BP is well controlled.   Dilated aortic root: Mildly dilated aortic root at 40 mm on echo 11/2020. Not specifically addressed today. He is planning to have PCP repeat chest CT for pulmonary nodules in November. Consider repeat imaging of aortic root at next office visit if not visible on chest CT.     Informed Consent   Shared Decision Making/Informed Consent The risks [chest pain, shortness of breath, cardiac arrhythmias, dizziness, blood pressure fluctuations, myocardial infarction, stroke/transient ischemic attack, nausea, vomiting, allergic reaction, radiation exposure, metallic taste sensation and life-threatening complications (estimated to be 1 in 10,000)], benefits (risk stratification, diagnosing coronary artery disease, treatment guidance) and alternatives of a nuclear stress test were discussed in detail with Mr. Koeth and he agrees to proceed.     Dispo: 6 months with Dr. Clifton James  Signed, Eligha Bridegroom, NP-C

## 2023-01-22 NOTE — H&P (View-Only) (Signed)
Cardiology Office Note:  .   Date:  01/30/2023  ID:  Luis Mitchell, DOB Aug 18, 1960, MRN [id number] PCP: Asencion Gowda.[name], MD  [facility] Providers Cardiologist:  Verne Carrow, MD    Patient Profile: .      PMH: CAD Cath 2007 >> 90% diagonal, 50% circumflex, 100% mRCA with BMS (2.75 x 16 mm Liberte) to mRCA >> EF 50% Nuclear stress test 2016 no ischemia Chronic combined CHF/ICM Echo 05/12/2017 LVEF 40-45%, G2DD, mildly dilated aortic root, biatrial enlargement                                                                                Echo 2022 LVEF 45 to 50%, G1DD, inferior wall hypokinesis, prior inferior MI, mild dilatation of aortic root 40 mm >> started on Entresto Hypertension Dilated aortic root Mild dilatation of aortic root measuring 40 mm on echo 12/06/2020 Obesity  Hyperlipidemia  He established care with Dr. Riley Kill and had cardiac cath June 21, 2006 s/p acute MI.  He has maintained consistent follow-up.   Last cardiology clinic visit was 11/27/2021 with Dr. Clifton James at which time he was doing well from a cardiac perspective and no changes were made to medical therapy. A one year follow-up was recommended.       History of Present Illness: .   Luis Mitchell is a pleasant 62 y.o. male who is here today for follow-up. 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 Feels fatigued at times, for example when sweeping his side walk yesterday.  Cannot specify timeframe when fatigue began, but at least over the last year. Had esophageal surgery 02/2022, is feeling better, no longer waking up feeling like he was choking. Push mows 4 acres about every 5-6 days. No regular exercise routine.  He continues to have brisk urine output. Occasional LE edema, no orthopnea, PND, dyspnea, palpitations, presyncope, syncope. Lengthy discussion about healthy diet and importance of weight loss.   ROS: See HPI       Studies Reviewed: .          Risk Assessment/Calculations:             Physical Exam:   VS:  BP 130/80   Pulse 61   Ht 5\' 7"  (1.702 m)   Wt 282 lb 3.2 oz (128 kg)   SpO2 99%   BMI 44.20 kg/m    Wt Readings from Last 3 Encounters:  01/30/23 282 lb 3.2 oz (128 kg)  02/12/22 283 lb (128.4 kg)  12/25/21 283 lb 9.6 oz (128.6 kg)    GEN: Obese, well developed in no acute distress NECK: No JVD; No carotid bruits CARDIAC: RRR, no murmurs, rubs, gallops RESPIRATORY:  Clear to auscultation without rales, wheezing or rhonchi  ABDOMEN: Soft, non-tender, protuberant EXTREMITIES:  No edema; No deformity     ASSESSMENT AND PLAN: .    CAD with ? angina: S/p BMS to RCA 2007, additional 90% in diagonal and 50% stenosis in Cx. Low risk myoview 2016.  He reports increased fatigue with activity, unable to specify time frame in which symptom has intensified. 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 We will get Lexiscan Myoview to evaluate for worsening ischemia.  He denies chest pain. Bleeds easily with scratches and cuts.  No acute blood loss. Continue Entresto, rosuvastatin, metoprolol, ezetimibe, clopidogrel, and amlodipine. Encouraged weight loss and heart healthy, mostly plant based, high fiber and protein diet. Encouraged 150 minutes moderate intensity exercise each week. ADDENDUM: He had abnormal lexiscan myoview on 02/03/23 and is recommended to undergo cardiac catheterization.  Risks and benefits of cardiac catheterization were discussed and he agrees to proceed.   Chronic combined CHF/ICM: LVEF 45-50%, inferior/inferoseptal HK, mild LVH, G1DD on echo 12/06/20. Reports "fatigue, I just give out" but does not specifically describe dyspnea on exertion. Volume status is difficult to assess due to body habitus. Occasional mild bilateral LE edema. No orthopnea, PND.  BP has improved on Entresto.  Continue Entresto, metoprolol. We will update lab work to evaluate renal function  today.   Hyperlipidemia: LDL 69 on 01/23/21. We will recheck today. Continue rosuvastatin, ezetimibe.   Hypertension: BP is well controlled.   Dilated aortic root: Mildly dilated aortic root at 40 mm on echo 11/2020. Not specifically addressed today. He is planning to have PCP repeat chest CT for pulmonary nodules in November. Consider repeat imaging of aortic root at next office visit if not visible on chest CT.     Informed Consent   Shared Decision Making/Informed Consent The risks [chest pain, shortness of breath, cardiac arrhythmias, dizziness, blood pressure fluctuations, myocardial infarction, stroke/transient ischemic attack, nausea, vomiting, allergic reaction, radiation exposure, metallic taste sensation and life-threatening complications (estimated to be 1 in 10,000)], benefits (risk stratification, diagnosing coronary artery disease, treatment guidance) and alternatives of a nuclear stress test were discussed in detail with Luis Mitchell and he agrees to proceed.     Dispo: 6 months with Dr. Clifton James  Signed, Eligha Bridegroom, NP-C

## 2023-01-26 ENCOUNTER — Other Ambulatory Visit: Payer: Self-pay | Admitting: Cardiovascular Disease

## 2023-01-30 ENCOUNTER — Ambulatory Visit: Payer: BC Managed Care – PPO | Attending: Nurse Practitioner | Admitting: Nurse Practitioner

## 2023-01-30 ENCOUNTER — Encounter: Payer: Self-pay | Admitting: Nurse Practitioner

## 2023-01-30 ENCOUNTER — Telehealth (HOSPITAL_COMMUNITY): Payer: Self-pay | Admitting: *Deleted

## 2023-01-30 VITALS — BP 130/80 | HR 61 | Ht 67.0 in | Wt 282.2 lb

## 2023-01-30 DIAGNOSIS — I255 Ischemic cardiomyopathy: Secondary | ICD-10-CM | POA: Diagnosis not present

## 2023-01-30 DIAGNOSIS — I2510 Atherosclerotic heart disease of native coronary artery without angina pectoris: Secondary | ICD-10-CM | POA: Diagnosis not present

## 2023-01-30 DIAGNOSIS — I10 Essential (primary) hypertension: Secondary | ICD-10-CM | POA: Diagnosis not present

## 2023-01-30 MED ORDER — ROSUVASTATIN CALCIUM 40 MG PO TABS: 40.0000 mg | ORAL_TABLET | Freq: Every day | ORAL | 3 refills | Status: DC

## 2023-01-30 MED ORDER — METOPROLOL SUCCINATE ER 100 MG PO TB24: 100.0000 mg | ORAL_TABLET | Freq: Every day | ORAL | 3 refills | Status: DC

## 2023-01-30 MED ORDER — AMLODIPINE BESYLATE 5 MG PO TABS: 5.0000 mg | ORAL_TABLET | Freq: Every day | ORAL | 3 refills | Status: DC

## 2023-01-30 MED ORDER — FUROSEMIDE 20 MG PO TABS: 20.0000 mg | ORAL_TABLET | Freq: Every day | ORAL | 3 refills | Status: DC

## 2023-01-30 MED ORDER — CLOPIDOGREL BISULFATE 75 MG PO TABS: 75.0000 mg | ORAL_TABLET | Freq: Every day | ORAL | 3 refills | Status: DC

## 2023-01-30 MED ORDER — EZETIMIBE 10 MG PO TABS: 10.0000 mg | ORAL_TABLET | Freq: Every day | ORAL | 3 refills | Status: DC

## 2023-01-30 MED ORDER — ENTRESTO 49-51 MG PO TABS: 1.0000 | ORAL_TABLET | Freq: Two times a day (BID) | ORAL | 11 refills | Status: DC

## 2023-01-30 NOTE — Telephone Encounter (Signed)
Per DPR left detailed instructions for MPI study.

## 2023-01-30 NOTE — Patient Instructions (Signed)
Medication Instructions:   Your physician recommends that you continue on your current medications as directed. Please refer to the Current Medication list given to you today.   *If you need a refill on your cardiac medications before your next appointment, please call your pharmacy*   Lab Work:  TODAY!!!! CMET/LIPID/CBC  If you have labs (blood work) drawn today and your tests are completely normal, you will receive your results only by: MyChart Message (if you have MyChart) OR A paper copy in the mail If you have any lab test that is abnormal or we need to change your treatment, we will call you to review the results.   Testing/Procedures:  You are scheduled for a Myocardial Perfusion Imaging Study on Tuesday, August 6 at 7:30 am.   Please arrive 15 minutes prior to your appointment time for registration and insurance purposes.   The test will take approximately 3 to 4 hours to complete; you may bring reading material. If someone comes with you to your appointment, they will need to remain in the main lobby due to limited space in the testing area.    How to prepare for your Myocardial Perfusion test:   Do not eat or drink 3 hours prior to your test, except you may have water.    Do not consume products containing caffeine (regular or decaffeinated) 12 hours prior to your test (ex: coffee, chocolate, soda, tea)   Do bring a list of your current medications with you. If not listed below, you may take your medications as normal.   Bring any held medication to your appointment, as you may be required to take it once the test is complete.   Do wear comfortable clothes (no overalls) and walking shoes. Tennis shoes are preferred. No  open toed shoes.  Do not wear cologne, aftershave or lotions (deodorant is allowed).   If these instructions are not followed, you test will have to be rescheduled.   Please report to [address] for your test. If you have  questions or concerns about your appointment, please call the Nuclear Lab at #[phone].  If you cannot keep your appointment, please provide 24 hour notification to the Nuclear lab to avoid a possible $50 charge to your account.       Follow-Up: At [facility], you and your health needs are our priority.  As part of our continuing mission to provide you with exceptional heart care, we have created designated Provider Care Teams.  These Care Teams include your primary Cardiologist (physician) and Advanced Practice Providers (APPs -  Physician Assistants and Nurse Practitioners) who all work together to provide you with the care you need, when you need it.  We recommend signing up for the patient portal called "MyChart".  Sign up information is provided on this After Visit Summary.  MyChart is used to connect with patients for Virtual Visits (Telemedicine).  Patients are able to view lab/test results, encounter notes, upcoming appointments, etc.  Non-urgent messages can be sent to your provider as well.   To learn more about what you can do with MyChart, go to [email or web address].    Your next appointment:   6 month(s)  Provider:   Verne Carrow, MD     Other Instructions  Your physician wants you to follow-up in: 6 months. You will receive a reminder letter in the mail two months in advance. If you don't receive a letter, please call our office to schedule the follow-up  appointment.

## 2023-02-03 ENCOUNTER — Ambulatory Visit (HOSPITAL_COMMUNITY): Payer: BC Managed Care – PPO | Attending: Cardiology

## 2023-02-03 DIAGNOSIS — I2510 Atherosclerotic heart disease of native coronary artery without angina pectoris: Secondary | ICD-10-CM

## 2023-02-03 DIAGNOSIS — I10 Essential (primary) hypertension: Secondary | ICD-10-CM | POA: Diagnosis not present

## 2023-02-03 DIAGNOSIS — I255 Ischemic cardiomyopathy: Secondary | ICD-10-CM | POA: Insufficient documentation

## 2023-02-03 LAB — MYOCARDIAL PERFUSION IMAGING
Base ST Depression (mm): 0 mm
Estimated workload: 1
Exercise duration (min): 1 min
Exercise duration (sec): 0 s
LV dias vol: 180 mL (ref 62–150)
LV sys vol: 110 mL
MPHR: 158 {beats}/min
Nuc Stress EF: 39 %
Peak HR: 76 {beats}/min
Percent HR: 48 %
Rest HR: 62 {beats}/min
Rest Nuclear Isotope Dose: 11 mCi
SDS: 4
SRS: 8
SSS: 13
ST Depression (mm): 0 mm
Stress Nuclear Isotope Dose: 31 mCi
TID: 1.32

## 2023-02-03 MED ORDER — TECHNETIUM TC 99M TETROFOSMIN IV KIT
31.0000 | PACK | Freq: Once | INTRAVENOUS | Status: AC | PRN
  Administered 2023-02-03: 31 via INTRAVENOUS

## 2023-02-03 MED ORDER — TECHNETIUM TC 99M TETROFOSMIN IV KIT
11.0000 | PACK | Freq: Once | INTRAVENOUS | Status: AC | PRN
  Administered 2023-02-03: 11 via INTRAVENOUS

## 2023-02-03 MED ORDER — REGADENOSON 0.4 MG/5ML IV SOLN
0.4000 mg | Freq: Once | INTRAVENOUS | Status: AC
  Administered 2023-02-03: 0.4 mg via INTRAVENOUS

## 2023-02-04 ENCOUNTER — Encounter (HOSPITAL_BASED_OUTPATIENT_CLINIC_OR_DEPARTMENT_OTHER): Payer: Self-pay | Admitting: *Deleted

## 2023-02-04 ENCOUNTER — Encounter (HOSPITAL_BASED_OUTPATIENT_CLINIC_OR_DEPARTMENT_OTHER): Payer: Self-pay

## 2023-02-05 ENCOUNTER — Telehealth: Payer: Self-pay | Admitting: *Deleted

## 2023-02-05 NOTE — Telephone Encounter (Signed)
Cardiac Catheterization scheduled at [facility] for: Monday February 09, 2023 7:30 AM Arrival time [facility] Main Entrance A at: 5:30 AM  Nothing to eat after midnight prior to procedure, clear liquids until 5 AM day of procedure.  Medication instructions: -Hold:  Lasix-AM of procedure -Other usual morning medications can be taken with sips of water including Plavix 75 mg.  Patient reports he is allergic to aspirin.  Plan to go home the same day, you will only stay overnight if medically necessary.  You must have responsible adult to drive you home.  Someone must be with you the first 24 hours after you arrive home.  Reviewed procedure instructions with patient.

## 2023-02-06 NOTE — Telephone Encounter (Signed)
Called pt lvm to call office to schedule post cath appointment in 3-4 weeks.

## 2023-02-06 NOTE — Telephone Encounter (Signed)
-----   Message from Levi Aland sent at 02/04/2023 12:29 PM EDT ----- Needs post cath appt. His MyChart is not working.

## 2023-02-09 ENCOUNTER — Encounter (HOSPITAL_COMMUNITY)
Admission: RE | Disposition: A | Discharge status: Home / Self Care | Payer: Self-pay | Source: Home / Self Care | Attending: Cardiovascular Disease

## 2023-02-09 ENCOUNTER — Ambulatory Visit (HOSPITAL_COMMUNITY)
Admission: RE | Admit: 2023-02-09 | Discharge: 2023-02-09 | Disposition: A | Discharge status: Home / Self Care | Payer: BC Managed Care – PPO | Source: Home / Self Care | Attending: Cardiovascular Disease | Admitting: Cardiovascular Disease

## 2023-02-09 ENCOUNTER — Telehealth: Payer: Self-pay | Admitting: *Deleted

## 2023-02-09 ENCOUNTER — Other Ambulatory Visit: Payer: Self-pay

## 2023-02-09 DIAGNOSIS — I252 Old myocardial infarction: Secondary | ICD-10-CM | POA: Insufficient documentation

## 2023-02-09 DIAGNOSIS — R0602 Shortness of breath: Secondary | ICD-10-CM | POA: Diagnosis not present

## 2023-02-09 DIAGNOSIS — Z79899 Other long term (current) drug therapy: Secondary | ICD-10-CM | POA: Diagnosis not present

## 2023-02-09 DIAGNOSIS — Z7902 Long term (current) use of antithrombotics/antiplatelets: Secondary | ICD-10-CM | POA: Insufficient documentation

## 2023-02-09 DIAGNOSIS — E669 Obesity, unspecified: Secondary | ICD-10-CM | POA: Insufficient documentation

## 2023-02-09 DIAGNOSIS — I2510 Atherosclerotic heart disease of native coronary artery without angina pectoris: Secondary | ICD-10-CM | POA: Diagnosis not present

## 2023-02-09 DIAGNOSIS — R9439 Abnormal result of other cardiovascular function study: Secondary | ICD-10-CM | POA: Diagnosis not present

## 2023-02-09 DIAGNOSIS — E785 Hyperlipidemia, unspecified: Secondary | ICD-10-CM | POA: Insufficient documentation

## 2023-02-09 DIAGNOSIS — I255 Ischemic cardiomyopathy: Secondary | ICD-10-CM | POA: Diagnosis not present

## 2023-02-09 DIAGNOSIS — R0609 Other forms of dyspnea: Secondary | ICD-10-CM | POA: Diagnosis not present

## 2023-02-09 DIAGNOSIS — Z6841 Body Mass Index (BMI) 40.0 and over, adult: Secondary | ICD-10-CM | POA: Diagnosis not present

## 2023-02-09 DIAGNOSIS — I110 Hypertensive heart disease with heart failure: Secondary | ICD-10-CM | POA: Insufficient documentation

## 2023-02-09 DIAGNOSIS — I5042 Chronic combined systolic (congestive) and diastolic (congestive) heart failure: Secondary | ICD-10-CM | POA: Diagnosis not present

## 2023-02-09 DIAGNOSIS — I2582 Chronic total occlusion of coronary artery: Secondary | ICD-10-CM | POA: Insufficient documentation

## 2023-02-09 HISTORY — PX: LEFT HEART CATH AND CORONARY ANGIOGRAPHY: CATH118249

## 2023-02-09 SURGERY — LEFT HEART CATH AND CORONARY ANGIOGRAPHY
Anesthesia: LOCAL

## 2023-02-09 MED ORDER — SODIUM CHLORIDE 0.9% FLUSH: 3.0000 mL | Freq: Two times a day (BID) | INTRAVENOUS | Status: DC

## 2023-02-09 MED ORDER — SODIUM CHLORIDE 0.9 % WEIGHT BASED INFUSION
3.0000 mL/kg/h | INTRAVENOUS | Status: AC
  Administered 2023-02-09: 3 mL/kg/h via INTRAVENOUS

## 2023-02-09 MED ORDER — CLOPIDOGREL BISULFATE 75 MG PO TABS: 75.0000 mg | ORAL_TABLET | ORAL | Status: DC

## 2023-02-09 MED ORDER — HEPARIN (PORCINE) IN NACL 1000-0.9 UT/500ML-% IV SOLN
INTRAVENOUS | Status: DC | PRN
  Administered 2023-02-09 (×2): 500 mL

## 2023-02-09 MED ORDER — ONDANSETRON HCL 4 MG/2ML IJ SOLN: 4.0000 mg | Freq: Four times a day (QID) | INTRAMUSCULAR | Status: DC | PRN

## 2023-02-09 MED ORDER — HEPARIN SODIUM (PORCINE) 1000 UNIT/ML IJ SOLN
INTRAMUSCULAR | Status: DC | PRN
  Administered 2023-02-09: 6000 [IU] via INTRAVENOUS

## 2023-02-09 MED ORDER — MIDAZOLAM HCL 2 MG/2ML IJ SOLN
INTRAMUSCULAR | Status: AC
  Filled 2023-02-09: qty 2

## 2023-02-09 MED ORDER — FENTANYL CITRATE (PF) 100 MCG/2ML IJ SOLN
INTRAMUSCULAR | Status: AC
  Filled 2023-02-09: qty 2

## 2023-02-09 MED ORDER — SODIUM CHLORIDE 0.9 % IV SOLN: INTRAVENOUS | Status: AC

## 2023-02-09 MED ORDER — FENTANYL CITRATE (PF) 100 MCG/2ML IJ SOLN
INTRAMUSCULAR | Status: DC | PRN
  Administered 2023-02-09: 50 ug via INTRAVENOUS

## 2023-02-09 MED ORDER — LABETALOL HCL 5 MG/ML IV SOLN: 10.0000 mg | INTRAVENOUS | Status: DC | PRN

## 2023-02-09 MED ORDER — LIDOCAINE HCL (PF) 1 % IJ SOLN
INTRAMUSCULAR | Status: DC | PRN
  Administered 2023-02-09: 2 mL via INTRADERMAL

## 2023-02-09 MED ORDER — SODIUM CHLORIDE 0.9 % IV SOLN: 250.0000 mL | INTRAVENOUS | Status: DC | PRN

## 2023-02-09 MED ORDER — SODIUM CHLORIDE 0.9% FLUSH: 3.0000 mL | INTRAVENOUS | Status: DC | PRN

## 2023-02-09 MED ORDER — HYDRALAZINE HCL 20 MG/ML IJ SOLN: 10.0000 mg | INTRAMUSCULAR | Status: DC | PRN

## 2023-02-09 MED ORDER — ACETAMINOPHEN 325 MG PO TABS: 650.0000 mg | ORAL_TABLET | ORAL | Status: DC | PRN

## 2023-02-09 MED ORDER — MIDAZOLAM HCL 2 MG/2ML IJ SOLN
INTRAMUSCULAR | Status: DC | PRN
  Administered 2023-02-09: 2 mg via INTRAVENOUS

## 2023-02-09 MED ORDER — VERAPAMIL HCL 2.5 MG/ML IV SOLN
INTRAVENOUS | Status: DC | PRN
  Administered 2023-02-09: 10 mL via INTRA_ARTERIAL

## 2023-02-09 MED ORDER — SODIUM CHLORIDE 0.9 % WEIGHT BASED INFUSION: 1.0000 mL/kg/h | INTRAVENOUS | Status: DC

## 2023-02-09 SURGICAL SUPPLY — 11 items
CATH 5FR JL3.5 JR4 ANG PIG MP (CATHETERS) IMPLANT
DEVICE RAD COMP TR BAND LRG (VASCULAR PRODUCTS) IMPLANT
ELECT DEFIB PAD ADLT CADENCE (PAD) IMPLANT
GLIDESHEATH SLEND SS 6F .021 (SHEATH) IMPLANT
GUIDEWIRE INQWIRE 1.5J.035X260 (WIRE) IMPLANT
INQWIRE 1.5J .035X260CM (WIRE) ×1
MAT PREVALON FULL STRYKER (MISCELLANEOUS) IMPLANT
PACK CARDIAC CATHETERIZATION (CUSTOM PROCEDURE TRAY) ×1 IMPLANT
PROTECTION STATION PRESSURIZED (MISCELLANEOUS) ×1
SET ATX-X65L (MISCELLANEOUS) IMPLANT
STATION PROTECTION PRESSURIZED (MISCELLANEOUS) IMPLANT

## 2023-02-09 NOTE — Telephone Encounter (Signed)
S/w pt to make post cath appt. Aug 29 to see MSW back.

## 2023-02-09 NOTE — Telephone Encounter (Signed)
-----   Message from Levi Aland sent at 02/04/2023 12:29 PM EDT ----- Needs post cath appt. His MyChart is not working.

## 2023-02-09 NOTE — Interval H&P Note (Signed)
History and Physical Interval Note:  02/09/2023 7:28 AM  Sherald Hess Dai  has presented today for surgery, with the diagnosis of abnormal stress test and know coronary disease.  The various methods of treatment have been discussed with the patient and family. After consideration of risks, benefits and other options for treatment, the patient has consented to  Procedure(s): LEFT HEART CATH AND CORONARY ANGIOGRAPHY (N/A) as a surgical intervention.  The patient's history has been reviewed, patient examined, no change in status, stable for surgery.  I have reviewed the patient's chart and labs.  Questions were answered to the patient's satisfaction.    Cath Lab Visit (complete for each Cath Lab visit)  Clinical Evaluation Leading to the Procedure:   ACS: No.  Non-ACS:    Anginal Classification: CCS II  Anti-ischemic medical therapy: Maximal Therapy (2 or more classes of medications)  Non-Invasive Test Results: High-risk stress test findings: cardiac mortality >3%/year  Prior CABG: No previous CABG        Verne Carrow

## 2023-02-10 ENCOUNTER — Encounter (HOSPITAL_COMMUNITY): Payer: Self-pay | Admitting: Cardiovascular Disease

## 2023-02-23 NOTE — Progress Notes (Unsigned)
Cardiology Office Note:  .   Date:  02/26/2023  ID:  Luis Mitchell, DOB 1961/06/20, MRN [id number] PCP: Asencion Gowda.[name], MD  [facility] Providers Cardiologist:  Verne Carrow, MD    Patient Profile: .      PMH: CAD Cath 2007 >> 90% diagonal, 50% circumflex, 100% mRCA with BMS (2.75 x 16 mm Liberte) to mRCA >> EF 50% Nuclear stress test 2016 no ischemia Abnormal myoview 02/03/23 LHC 02/09/23  Mild nonobstrutive disease in pLAD, moderate nonobstructive disease in Cx and obtuse marginal branch, CTOmRCA in stented segment >>L to R collaterals Chronic combined CHF/ICM Echo 05/12/2017 LVEF 40-45%, G2DD, mildly dilated aortic root, biatrial enlargement                                                                                Echo 2022 LVEF 45 to 50%, G1DD, inferior wall hypokinesis, prior inferior MI, mild dilatation of aortic root 40 mm >> started on Entresto LVEDP on LHC 14 mmHg 02/09/23 Hypertension Dilated aortic root Mild dilatation of aortic root measuring 40 mm on echo 12/06/2020 Obesity  Hyperlipidemia  He established care with Dr. Riley Kill and had cardiac cath June 21, 2006 s/p acute MI. He has maintained consistent follow-up.   Seen on 11/27/2021 by Dr. Clifton James at which time he was doing well from a cardiac perspective and no changes were made to medical therapy. A one year follow-up was recommended.  Seen in clinic by me on 01/30/23 for follow-up. Reported fatigue at various times. 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 Felt very fatigued when sweeping his side walk yesterday. Cannot specify timeframe when fatigue began, but at least over the last year. Had esophageal surgery 02/2022, is feeling better, no longer waking up feeling like he was choking. Push mows 4 acres about every 5-6 days. No regular exercise routine. Has brisk urine output on Lasix. Occasional LE edema, no orthopnea, PND, dyspnea, palpitations,  presyncope, syncope. Lengthy discussion about healthy diet and importance of weight loss. Was advised to undergo nuclear stress test for evaluation of worsening ischemia.    Lexiscan Myoview completed 02/03/2023 revealed concern for abnormal LV perfusion.  He was advised to undergo cardiac catheterization. LHC 02/09/23 with Dr. Clifton James revealed mild nonobstructive disease in the proximal LAD, moderate nonobstructive disease in the circumflex and obtuse marginal branch, chronic total occlusion of the mid RCA in the stented segment.  The distal RCA branches fill from left to right collaterals.  LVEDP 14 mmHg. Recommendation medical management of CAD.       History of Present Illness: .   Luis Mitchell is a pleasant 62 y.o. male who is here today for post-cath follow-up. Reports he continues to give out of breath easily with minimal exertion. Continues to do work around home but no regular exercise. No chest pain or palpitations. Working on weight loss, eating less than 2000 calories daily. He asks about weight loss supplements which I advised are generally not well researched, but usually safe. He denies orthopnea, edema, PND, presyncope, syncope. Lengthy discussion about management of CAD and lifestyle modifications to lose weight and reduce CV risk.  ROS: See HPI       Studies Reviewed: .         Risk Assessment/Calculations:             Physical Exam:   VS:  BP 132/80   Pulse 70   Ht 5' 7.5" (1.715 m)   Wt 287 lb 9.6 oz (130.5 kg)   SpO2 99%   BMI 44.38 kg/m    Wt Readings from Last 3 Encounters:  02/26/23 287 lb 9.6 oz (130.5 kg)  02/09/23 283 lb (128.4 kg)  02/03/23 282 lb (127.9 kg)    GEN: Obese, well developed in no acute distress NECK: No JVD; No carotid bruits CARDIAC: RRR, no murmurs, rubs, gallops RESPIRATORY:  Clear to auscultation without rales, wheezing or rhonchi  ABDOMEN: Soft, non-tender, protuberant EXTREMITIES:  No edema; No deformity     ASSESSMENT AND PLAN:  .    CAD with stable angina: S/p BMS to RCA 2007, additional 90% in diagonal and 50% stenosis in Cx. Abnormal myoview 02/03/23 with concern for worsening ischemia. Underwent LHC 02/09/23 which revealed mild nonobstructive disease in pLAD, moderate nonobstructive disease in CX and obtuse marginal branch, chronic total occlusion of mRCA in stented segment. Distal RCA branches fill from L to R collaterals, recommendation for medical management. We discussed these results in detail. Reviewed medical management of CAD that he is currently on and other agents that could be added. He denies chest pain but continues to have DOE.  Encouraged gradual increase in physical activity. Advised him to notify us if he develops worsening symptoms with exertion. No bleeding concerns. Continue GDMT including clopidogrel, amlodipine, rosuvastatin, metoprolol, ezetimibe, Entresto.  Chronic combined CHF/ICM: LVEF 45-50%, inferior/inferoseptal HK, mild LVH, G1DD on echo 12/06/20. LVEDP 14 mmHg on [facility] 02/09/23. Has dyspnea on exertion. Volume status is difficult to assess due to body habitus but no evidence of volume overload on exam. No orthopnea, edema, PND. Encouraged him to continue to work on weight loss.  We are repeating echocardiogram in the setting of aortic dilatation and will assess heart function at that time.  Continue GDMT including Lasix, metoprolol, Entresto.  Hyperlipidemia LDL goal < 55: LDL 75 on 01/30/23. He is not at goal on rosuvastatin and ezetimibe.  He is working on weight loss and dietary improvements.  We will recheck fasting labs in 6 months prior to next office visit.  Consider PCSK9 inhibitor if LDL still not at goal.  Morbid obesity: Is motivated to lose weight. He asks about weight loss supplements. Advised him to avoid Vitamin e as an ingredient due to blood thinning properties. Lengthy discussion of lifestyle modifications that can improve weight loss. He is tracking and limiting his caloric intake.  Encouraged healthy diet of lean protein, fruit, vegetables, whole grains. Encouraged gradual increase of physical activity to achieve 150 minutes of moderate intensity exercise each week.   Hypertension: BP initially elevated but improved on my recheck. No medication changes today.   Dilated aortic root: Mildly dilated aortic root at 40 mm on echo 11/2020.  We will get repeat echocardiogram to monitor aortic dilatation.         Dispo: 6 months with Dr. Clifton James or me  Signed, Eligha Bridegroom, NP-C

## 2023-02-26 ENCOUNTER — Ambulatory Visit: Payer: BC Managed Care – PPO | Attending: Nurse Practitioner | Admitting: Nurse Practitioner

## 2023-02-26 ENCOUNTER — Encounter: Payer: Self-pay | Admitting: Nurse Practitioner

## 2023-02-26 VITALS — BP 132/80 | HR 70 | Ht 67.5 in | Wt 287.6 lb

## 2023-02-26 DIAGNOSIS — I77810 Thoracic aortic ectasia: Secondary | ICD-10-CM

## 2023-02-26 DIAGNOSIS — I10 Essential (primary) hypertension: Secondary | ICD-10-CM | POA: Diagnosis not present

## 2023-02-26 DIAGNOSIS — I5042 Chronic combined systolic (congestive) and diastolic (congestive) heart failure: Secondary | ICD-10-CM

## 2023-02-26 DIAGNOSIS — I25118 Atherosclerotic heart disease of native coronary artery with other forms of angina pectoris: Secondary | ICD-10-CM | POA: Diagnosis not present

## 2023-02-26 DIAGNOSIS — I255 Ischemic cardiomyopathy: Secondary | ICD-10-CM

## 2023-02-26 DIAGNOSIS — E782 Mixed hyperlipidemia: Secondary | ICD-10-CM | POA: Diagnosis not present

## 2023-02-26 NOTE — Patient Instructions (Signed)
Medication Instructions:  Your physician recommends that you continue on your current medications as directed. Please refer to the Current Medication list given to you today.  *If you need a refill on your cardiac medications before your next appointment, please call your pharmacy*   Lab Work: 6 MO: FASTING LIPID  / CMET   If you have labs (blood work) drawn today and your tests are completely normal, you will receive your results only by: MyChart Message (if you have MyChart) OR A paper copy in the mail If you have any lab test that is abnormal or we need to change your treatment, we will call you to review the results.   Testing/Procedures: Your physician has requested that you have an echocardiogram. Echocardiography is a painless test that uses sound waves to create images of your heart. It provides your doctor with information about the size and shape of your heart and how well your heart's chambers and valves are working. This procedure takes approximately one hour. There are no restrictions for this procedure. Please do NOT wear cologne, perfume, aftershave, or lotions (deodorant is allowed). Please arrive 15 minutes prior to your appointment time.   Follow-Up: At [facility], you and your health needs are our priority.  As part of our continuing mission to provide you with exceptional heart care, we have created designated Provider Care Teams.  These Care Teams include your primary Cardiologist (physician) and Advanced Practice Providers (APPs -  Physician Assistants and Nurse Practitioners) who all work together to provide you with the care you need, when you need it.  We recommend signing up for the patient portal called "MyChart".  Sign up information is provided on this After Visit Summary.  MyChart is used to connect with patients for Virtual Visits (Telemedicine).  Patients are able to view lab/test results, encounter notes, upcoming appointments, etc.  Non-urgent  messages can be sent to your provider as well.   To learn more about what you can do with MyChart, go to [email or web address].    Your next appointment:   6 month(s)  Provider:   Verne Carrow, MD     Other Instructions

## 2023-03-17 ENCOUNTER — Ambulatory Visit (HOSPITAL_COMMUNITY): Payer: BC Managed Care – PPO | Attending: Nurse Practitioner

## 2023-03-17 DIAGNOSIS — E782 Mixed hyperlipidemia: Secondary | ICD-10-CM

## 2023-03-17 DIAGNOSIS — I25118 Atherosclerotic heart disease of native coronary artery with other forms of angina pectoris: Secondary | ICD-10-CM | POA: Diagnosis not present

## 2023-03-17 DIAGNOSIS — I77810 Thoracic aortic ectasia: Secondary | ICD-10-CM | POA: Diagnosis not present

## 2023-03-17 LAB — ECHOCARDIOGRAM COMPLETE
Area-P 1/2: 2.98 cm2
S' Lateral: 4 cm

## 2023-03-31 ENCOUNTER — Encounter: Payer: Self-pay | Admitting: *Deleted

## 2023-04-09 ENCOUNTER — Telehealth: Payer: Self-pay | Admitting: Nurse Practitioner

## 2023-04-09 NOTE — Telephone Encounter (Signed)
Follow Up:      Patient said he received a letter to call to get his Echo results.

## 2023-04-09 NOTE — Telephone Encounter (Signed)
Echo results given to pt. Pt requested I call back and leave message on voice mail with results. Read Orinda Kenner message on Voicemail left for pt.

## 2023-07-19 ENCOUNTER — Encounter (HOSPITAL_COMMUNITY): Payer: Self-pay

## 2023-07-19 ENCOUNTER — Other Ambulatory Visit: Payer: Self-pay

## 2023-07-19 ENCOUNTER — Emergency Department (HOSPITAL_COMMUNITY)
Admission: EM | Admit: 2023-07-19 | Discharge: 2023-07-19 | Disposition: A | Discharge status: Home / Self Care | Payer: BC Managed Care – PPO | Attending: Emergency Medicine | Admitting: Emergency Medicine

## 2023-07-19 DIAGNOSIS — T7840XA Allergy, unspecified, initial encounter: Secondary | ICD-10-CM | POA: Diagnosis present

## 2023-07-19 MED ORDER — EPINEPHRINE 0.3 MG/0.3ML IJ SOAJ: 0.3000 mg | INTRAMUSCULAR | 0 refills | Status: AC | PRN

## 2023-07-19 MED ORDER — EPINEPHRINE 0.3 MG/0.3ML IJ SOAJ
0.3000 mg | Freq: Once | INTRAMUSCULAR | Status: AC
  Filled 2023-07-19: qty 0.3

## 2023-07-19 MED ORDER — PREDNISONE 20 MG PO TABS
60.0000 mg | ORAL_TABLET | Freq: Once | ORAL | Status: AC
  Administered 2023-07-19: 60 mg via ORAL
  Filled 2023-07-19: qty 3

## 2023-07-19 MED ORDER — EPINEPHRINE 0.3 MG/0.3ML IJ SOAJ
INTRAMUSCULAR | Status: AC
  Administered 2023-07-19: 0.3 mg via INTRAMUSCULAR
  Filled 2023-07-19: qty 0.3

## 2023-07-19 MED ORDER — PREDNISONE 10 MG PO TABS: 40.0000 mg | ORAL_TABLET | Freq: Every day | ORAL | 0 refills | Status: AC

## 2023-07-19 MED ORDER — SODIUM CHLORIDE 0.9 % IV BOLUS
500.0000 mL | Freq: Once | INTRAVENOUS | Status: AC
  Administered 2023-07-19: 500 mL via INTRAVENOUS

## 2023-07-19 NOTE — ED Triage Notes (Signed)
Pt woke up around 0200am due to facial swelling from a suspected allergic reaction. Denies SHOB, tongue swelling or tingling. Reported taking six 25 mg Benadryl.

## 2023-07-19 NOTE — ED Provider Notes (Signed)
63 yo male ho angiodedema presented today at 630 am Took 6 benadryl and then came to ed Epi pen here and prednisone Swelling improving BP and hr ok  Reassess around 930 9:17 AM Patient feels that he is improved.  However on my exam he continues to have significant swelling of his lip and the right lower side of his face. There is no edema noted in his mouth with tongue and posterior oropharynx appearing normal He is not having any shortness of breath or hypotension I am continued observation at this time    Margarita Grizzle, MD 07/19/23 1635

## 2023-07-19 NOTE — Discharge Instructions (Signed)
Please take medications as prescribed.  Continue Benadryl 25 mg every 8 hours for the next 3 days. Take prednisone prescription as prescribed. Please get your EpiPen filled and use it immediately if you have worsening symptoms. If you feel like you are having worsening swelling around your mouth, dyspnea, difficulty swallowing or breathing, please return to the emergency department immediately. Please follow-up with your doctor

## 2023-07-19 NOTE — ED Notes (Signed)
Pt reports he had a subway sub last evening, went to bed and reports " my lips felt tingly, but I didn't feel anything else." Pt woke up reporting swelling to the right side of his face and his bottom lip. Pt denies any sob, chest pain, n/v/d and pt able to clear his secretions without difficulty. Pt able to speak in full sentences without taking a breath. PT also took 6-25mg  Benadryl PTA. PT on cardiac monitor and VSS. Call light in reach and wife at bedside.

## 2023-07-19 NOTE — ED Provider Notes (Signed)
Emergency Department Provider Note   I have reviewed the triage vital signs and the nursing notes.   HISTORY  Chief Complaint Allergic Reaction   HPI Luis Mitchell is a 63 y.o. male past history reviewed below presents emergency department with lip and face swelling after eating a [employer] sandwich.  Patient tells me that he has had similar reactions in the past and has a known reaction like this to " preservatives."  Tried managing at home with Benadryl and took 6 tablets at home prior to ED evaluation.  He is not feeling tightness or swelling in his throat, tongue.  No abdominal discomfort, nausea/vomiting, diarrhea.  He has taken epinephrine in the past for this but states at times he has taken just prednisone with improvement in symptoms.   Past Medical History:  Diagnosis Date   Chronic combined systolic (congestive) and diastolic (congestive) heart failure (HCC)    Coronary atherosclerosis of native coronary artery    a. MI s/p BMS to [facility] 2007 with residual 90% diagonal, 50% Cx disease. b. stress test 08/2014 without ischemia.   Dilated aortic root (HCC)    HTN (hypertension)    Hyperlipidemia    Ischemic cardiomyopathy    Myocardial infarction (HCC) 06/20/2006   Obesity    Pulmonary nodules     Review of Systems  Constitutional: No fever/chills ENT: Positive lip/face swelling.  Cardiovascular: Denies chest pain. Respiratory: Denies shortness of breath. Gastrointestinal: No abdominal pain.  No nausea, no vomiting.  Musculoskeletal: Negative for back pain. Skin: Negative for rash. ____________________________________________   PHYSICAL EXAM:  VITAL SIGNS: Vitals:   07/19/23 1047 07/19/23 1115  BP: (!) 131/90 136/89  Pulse: 70 86  Resp: (!) 30 (!) 30  Temp:    SpO2: 93% 95%    Constitutional: Alert and oriented. Well appearing and in no acute distress. Eyes: Conjunctivae are normal.  Head: Atraumatic. Nose: No congestion/rhinnorhea. Mouth/Throat:  Mucous membranes are moist.  Oropharynx non-erythematous.  Significant angioedema to the lips, worse in the lower lip as well as some facial swelling.  The tongue appears normal size.  I can easily visualize the posterior pharynx.  Patient has a clear voice and is managing his oral secretions.  Neck: No stridor.  Cardiovascular: Normal rate, regular rhythm. Good peripheral circulation. Grossly normal heart sounds.   Respiratory: Normal respiratory effort.  No retractions. Lungs CTAB. Gastrointestinal: Soft and nontender. No distention.  Musculoskeletal: No lower extremity tenderness nor edema. No gross deformities of extremities. Neurologic:  Normal speech and language.  Skin:  Skin is warm, dry and intact. No rash noted.  ____________________________________________   PROCEDURES  Procedure(s) performed:   Procedures  CRITICAL CARE Performed by: Maia Plan Total critical care time: 35 minutes Critical care time was exclusive of separately billable procedures and treating other patients. Critical care was necessary to treat or prevent imminent or life-threatening deterioration. Critical care was time spent personally by me on the following activities: development of treatment plan with patient and/or surrogate as well as nursing, discussions with consultants, evaluation of patient's response to treatment, examination of patient, obtaining history from patient or surrogate, ordering and performing treatments and interventions, ordering and review of laboratory studies, ordering and review of radiographic studies, pulse oximetry and re-evaluation of patient's condition.  Luis Bene, MD Emergency Medicine  ____________________________________________   INITIAL IMPRESSION / ASSESSMENT AND PLAN / ED COURSE  Pertinent labs & imaging results that were available during my care of the patient were reviewed by me  and considered in my medical decision making (see chart for details).   This  patient is Presenting for Evaluation of allergic reaction, which does require a range of treatment options, and is a complaint that involves a high risk of morbidity and mortality.  The Differential Diagnoses includes anaphylaxis, angioedema, hereditary angioedema, etc.   Critical Interventions-    Medications  EPINEPHrine (EPI-PEN) injection 0.3 mg (0.3 mg Intramuscular Given 07/19/23 0636)  predniSONE (DELTASONE) tablet 60 mg (60 mg Oral Given 07/19/23 0637)  sodium chloride 0.9 % bolus 500 mL (0 mLs Intravenous Stopped 07/19/23 0759)    Reassessment after intervention:  swelling starting to decrease.   Cardiac Monitor Tracing which shows NSR.   Medical Decision Making: Summary:  Patient arrives to the emergency department by private vehicle with significant swelling to his lips and face after eating a [employer] sandwich.  Has had this reaction in the past.  Given angioedema involving the face, plan for EpiPen, prednisone, IV fluids and monitoring.  Patient is already taken Benadryl at home.   Reevaluation with update and discussion with patient. Care transferred to Dr. Rosalia Hammers pending reassessment.   Patient's presentation is most consistent with acute presentation with potential threat to life or bodily function.   Disposition: pending  ____________________________________________  FINAL CLINICAL IMPRESSION(S) / ED DIAGNOSES  Final diagnoses:  Allergic reaction, initial encounter     NEW OUTPATIENT MEDICATIONS STARTED DURING THIS VISIT:  Discharge Medication List as of 07/19/2023 12:34 PM     START taking these medications   Details  EPINEPHrine 0.3 mg/0.3 mL IJ SOAJ injection Inject 0.3 mg into the muscle as needed for anaphylaxis., Starting Sun 07/19/2023, Normal    predniSONE (DELTASONE) 10 MG tablet Take 4 tablets (40 mg total) by mouth daily., Starting Sun 07/19/2023, Normal        Note:  This document was prepared using Dragon voice recognition software and may include  unintentional dictation errors.  Luis Bene, MD, [facility] Emergency Medicine    Luis Mitchell, Arlyss Repress, MD 07/23/23 [phone]

## 2023-08-28 ENCOUNTER — Ambulatory Visit: Payer: BC Managed Care – PPO | Attending: Nurse Practitioner

## 2023-08-28 DIAGNOSIS — I77810 Thoracic aortic ectasia: Secondary | ICD-10-CM

## 2023-08-28 DIAGNOSIS — E782 Mixed hyperlipidemia: Secondary | ICD-10-CM

## 2023-11-03 LAB — LAB REPORT - SCANNED: EGFR: 91

## 2023-11-16 ENCOUNTER — Ambulatory Visit: Attending: Cardiovascular Disease | Admitting: Cardiovascular Disease

## 2023-11-16 ENCOUNTER — Encounter: Payer: Self-pay | Admitting: Cardiovascular Disease

## 2023-11-16 VITALS — BP 146/96 | HR 80 | Ht 67.5 in | Wt 289.6 lb

## 2023-11-16 DIAGNOSIS — E782 Mixed hyperlipidemia: Secondary | ICD-10-CM | POA: Diagnosis not present

## 2023-11-16 DIAGNOSIS — I255 Ischemic cardiomyopathy: Secondary | ICD-10-CM

## 2023-11-16 DIAGNOSIS — I10 Essential (primary) hypertension: Secondary | ICD-10-CM | POA: Diagnosis not present

## 2023-11-16 DIAGNOSIS — I2510 Atherosclerotic heart disease of native coronary artery without angina pectoris: Secondary | ICD-10-CM

## 2023-11-16 NOTE — Patient Instructions (Signed)
 Medication Instructions:  No changes *If you need a refill on your cardiac medications before your next appointment, please call your pharmacy*  Lab Work: Today: lipids, liver function  If you have labs (blood work) drawn today and your tests are completely normal, you will receive your results only by: MyChart Message (if you have MyChart) OR A paper copy in the mail If you have any lab test that is abnormal or we need to change your treatment, we will call you to review the results.  Testing/Procedures: none  Follow-Up: At [facility], you and your health needs are our priority.  As part of our continuing mission to provide you with exceptional heart care, our providers are all part of one team.  This team includes your primary Cardiologist (physician) and Advanced Practice Providers or APPs (Physician Assistants and Nurse Practitioners) who all work together to provide you with the care you need, when you need it.  Your next appointment:   12 month(s)  Provider:   Antoinette Batman, MD    We recommend signing up for the patient portal called "MyChart".  Sign up information is provided on this After Visit Summary.  MyChart is used to connect with patients for Virtual Visits (Telemedicine).  Patients are able to view lab/test results, encounter notes, upcoming appointments, etc.  Non-urgent messages can be sent to your provider as well.   To learn more about what you can do with MyChart, go to [email or web address].   Other Instructions

## 2023-11-16 NOTE — Progress Notes (Signed)
 Chief Complaint  Patient presents with   Follow-up    CAD   History of Present Illness: 63 yo male with history of CAD, chronic combined diastolic and systolic CHF and HTN here today for cardiac follow up. Cardiac cath 2007 with 90% diagonal, 50% Circumflex, 100% mid RCA. A bare metal stent was placed in the mid RCA. Echo June 2022 with LVEF=45-50%. Hypokinesis of the inferior wall. Grade 1 diastolic dysfunction. His lasix  was increased and he was started on Entresto . CT chest May 2023 with stable pulmonary nodules. No dilation of the ascending aorta. Abnormal nuclear stress test in August 2024. Cardiac cath August 2024 with CTO of the mid RCA with left to right collaterals. Non-obstructive disease in the LAD and Circumflex.    He is here today for follow up. The patient denies any chest pain, dyspnea, palpitations, lower extremity edema, orthopnea, PND, dizziness, near syncope or syncope.     Primary Care Physician: Brenita Callow, L.Rozelle Corning, MD (Inactive)  Past Medical History:  Diagnosis Date   Chronic combined systolic (congestive) and diastolic (congestive) heart failure (HCC)    Coronary atherosclerosis of native coronary artery    a. MI s/p BMS to [facility] 2007 with residual 90% diagonal, 50% Cx disease. b. stress test 08/2014 without ischemia.   Dilated aortic root (HCC)    HTN (hypertension)    Hyperlipidemia    Ischemic cardiomyopathy    Myocardial infarction (HCC) 06/20/2006   Obesity    Pulmonary nodules     Past Surgical History:  Procedure Laterality Date   CORONARY STENT PLACEMENT     ESOPHAGEAL MANOMETRY N/A 03/05/2022   Procedure: ESOPHAGEAL MANOMETRY (EM);  Surgeon: Nannette Babe, MD;  Location: [facility] END[facility];  Service: Gastroenterology;  Laterality: N/A;   ESOPHAGOGASTRODUODENOSCOPY     HERNIA REPAIR     LEFT HEART CATH AND CORONARY ANGIOGRAPHY N/A 02/09/2023   Procedure: LEFT HEART CATH AND CORONARY ANGIOGRAPHY;  Surgeon: Odie Benne, MD;  Location: [facility] INVASIVE CV  LAB;  Service: Cardiovascular;  Laterality: N/A;   None      Current Outpatient Medications  Medication Sig Dispense Refill   albuterol (VENTOLIN HFA) 108 (90 Base) MCG/ACT inhaler Inhale into the lungs.     amLODipine  (NORVASC ) 5 MG tablet Take 1 tablet (5 mg total) by mouth daily. 90 tablet 3   cetirizine  (ZYRTEC ) 10 MG tablet Take 1 tablet (10 mg total) by mouth daily. (Patient taking differently: Take 10 mg by mouth daily as needed for allergies.) 30 tablet 11   clopidogrel  (PLAVIX ) 75 MG tablet Take 1 tablet (75 mg total) by mouth daily. 90 tablet 3   diphenhydrAMINE  (BENADRYL ) 25 MG tablet Take 25-50 mg by mouth daily as needed for allergies.     EPINEPHrine  0.3 mg/0.3 mL IJ SOAJ injection Inject 0.3 mg into the muscle as needed for anaphylaxis. 1 each 0   ezetimibe  (ZETIA ) 10 MG tablet Take 1 tablet (10 mg total) by mouth daily. 90 tablet 3   furosemide  (LASIX ) 20 MG tablet Take 1 tablet (20 mg total) by mouth daily. 90 tablet 3   guaiFENesin (MUCINEX) 600 MG 12 hr tablet Take 600 mg by mouth 2 (two) times daily as needed for to loosen phlegm.     metoprolol  succinate (TOPROL -XL) 100 MG 24 hr tablet Take 1 tablet (100 mg total) by mouth daily. 90 tablet 3   Phenylephrine HCl (NEO-SYNEPHRINE NA) Place 1 drop into the nose at bedtime as needed (congestion).     predniSONE  (  DELTASONE ) 10 MG tablet Take 4 tablets (40 mg total) by mouth daily. 20 tablet 0   rosuvastatin  (CRESTOR ) 40 MG tablet Take 1 tablet (40 mg total) by mouth daily. 90 tablet 3   sacubitril-valsartan (ENTRESTO ) 49-51 MG Take 1 tablet by mouth 2 (two) times daily. 60 tablet 11   No current facility-administered medications for this visit.    Allergies  Allergen Reactions   Aspirin Swelling   Banana Swelling    Facial swelling   Nsaids Swelling   Other Swelling    Preservatives   Tolmetin Swelling   Kiwi Extract Rash    Social History   Socioeconomic History   Marital status: Married    Spouse name: Not on  file   Number of children: 2   Years of education: Not on file   Highest education level: Not on file  Occupational History   Occupation: Carpenter  Tobacco Use   Smoking status: Former    Types: Cigars   Smokeless tobacco: Never  Vaping Use   Vaping status: Never Used  Substance and Sexual Activity   Alcohol use: Yes    Alcohol/week: 42.0 standard drinks of alcohol    Types: 42 Standard drinks or equivalent per week    Comment: Drinks 4 per day   Drug use: No   Sexual activity: Not on file  Other Topics Concern   Not on file  Social History Narrative   Not on file   Social Drivers of Health   Financial Resource Strain: Not on file  Food Insecurity: Not on file  Transportation Needs: Not on file  Physical Activity: Not on file  Stress: Not on file  Social Connections: Not on file  Intimate Partner Violence: Not on file    Family History  Problem Relation Age of Onset   Heart attack Father    Colon cancer Neg Hx    Colon polyps Neg Hx    Esophageal cancer Neg Hx    Kidney disease Neg Hx    Stomach cancer Neg Hx     Review of Systems:  As stated in the HPI and otherwise negative.   BP (!) 146/96   Pulse 80   Ht 5' 7.5" (1.715 m)   Wt 289 lb 9.6 oz (131.4 kg)   SpO2 97%   BMI 44.69 kg/m   Physical Examination: General: Well developed, well nourished, NAD  HEENT: OP clear, mucus membranes moist  SKIN: warm, dry. No rashes. Neuro: No focal deficits  Musculoskeletal: Muscle strength 5/5 all ext  Psychiatric: Mood and affect normal  Neck: No JVD, no carotid bruits, no thyromegaly, no lymphadenopathy.  Lungs:Clear bilaterally, no wheezes, rhonci, crackles Cardiovascular: Regular rate and rhythm. No murmurs, gallops or rubs. Abdomen:Soft. Bowel sounds present. Non-tender.  Extremities: No lower extremity edema. Pulses are 2 + in the bilateral DP/PT.  EKG:  EKG is ordered today. The ekg ordered today demonstrates:  EKG Interpretation Date/Time:  Monday Nov 16 2023 10:22:26 EDT Ventricular Rate:  72 PR Interval:  236 QRS Duration:  118 QT Interval:  394 QTC Calculation: 431 R Axis:   10  Text Interpretation: Sinus rhythm with 1st degree A-V block with occasional Premature ventricular complexes Possible old inferior posterior infarct Premature ventricular complexes are now Present Confirmed by Antoinette Batman [phone]) on 11/16/2023 10:35:32 AM     Recent Labs: 01/30/2023: ALT 20; BUN 14; Creatinine, Ser 1.03; Hemoglobin 15.0; Platelets 288; Potassium 4.2; Sodium 135   Lipid Panel  Component Value Date/Time   CHOL 140 01/30/2023 0901   TRIG 102 01/30/2023 0901   HDL 46 01/30/2023 0901   CHOLHDL 3.0 01/30/2023 0901   CHOLHDL 2.8 06/09/2016 0859   VLDL 15 06/09/2016 0859   LDLCALC 75 01/30/2023 0901   LDLDIRECT 152.7 10/24/2009 0000     Wt Readings from Last 3 Encounters:  11/16/23 289 lb 9.6 oz (131.4 kg)  02/26/23 287 lb 9.6 oz (130.5 kg)  02/09/23 283 lb (128.4 kg)    Assessment and Plan:   1. HTN: BP is well controlled at home. Continue current therapy   2. CAD without angina: Cardiac cath in 2024 with CTO of the RCA stented segment with brisk filling of the RCA left to right collaterals. Continue Plavix , Zetia , Crestor  or Toprol .    3. Hyperlipidemia: LDL 75 in August 2024. Continue Crestor  and Zetia . Will check lipids and LFTs today.    4. Ischemic cardiomyopathy: LVEF=45-50% by echo in 2022. Inferior wall hypokinesis. Prior inferior MI. Continue Toprol  and Entresto . Volume status is ok. Continue Lasix .   Labs/ tests ordered today include:   Orders Placed This Encounter  Procedures   Lipid panel   Hepatic function panel   EKG 12-Lead   Disposition:   F/U with me in 12 months  Signed, Antoinette Batman, MD 11/16/2023 11:07 AM    [facility] [address] [place], [place], [place]  [address] Phone: [phone]; Fax: [phone]

## 2023-11-17 ENCOUNTER — Ambulatory Visit: Payer: Self-pay | Admitting: Cardiovascular Disease

## 2023-11-17 LAB — LIPID PANEL
Chol/HDL Ratio: 2.4 ratio (ref 0.0–5.0)
Cholesterol, Total: 141 mg/dL (ref 100–199)
HDL: 59 mg/dL (ref >39)
LDL Chol Calc (NIH): 67 mg/dL (ref 0–99)
Triglycerides: 74 mg/dL (ref 0–149)
VLDL Cholesterol Cal: 15 mg/dL (ref 5–40)

## 2023-11-17 LAB — HEPATIC FUNCTION PANEL
ALT: 26 IU/L (ref 0–44)
AST: 18 IU/L (ref 0–40)
Albumin: 4.2 g/dL (ref 3.9–4.9)
Alkaline Phosphatase: 68 IU/L (ref 44–121)
Bilirubin Total: 0.5 mg/dL (ref 0.0–1.2)
Bilirubin, Direct: 0.2 mg/dL (ref 0.00–0.40)
Total Protein: 6.2 g/dL (ref 6.0–8.5)

## 2024-01-27 ENCOUNTER — Other Ambulatory Visit: Payer: Self-pay | Admitting: Nurse Practitioner

## 2024-02-03 ENCOUNTER — Ambulatory Visit
Admission: RE | Admit: 2024-02-03 | Discharge: 2024-02-03 | Disposition: A | Discharge status: Home / Self Care | Source: Ambulatory Visit | Attending: Physician Assistant | Admitting: Physician Assistant

## 2024-02-03 ENCOUNTER — Other Ambulatory Visit: Payer: Self-pay | Admitting: Physician Assistant

## 2024-02-03 DIAGNOSIS — R059 Cough, unspecified: Secondary | ICD-10-CM

## 2024-02-12 ENCOUNTER — Other Ambulatory Visit: Payer: Self-pay | Admitting: Nurse Practitioner

## 2024-02-20 ENCOUNTER — Other Ambulatory Visit: Payer: Self-pay | Admitting: Nurse Practitioner

## 2024-02-26 ENCOUNTER — Other Ambulatory Visit: Payer: Self-pay | Admitting: Nurse Practitioner

## 2024-03-21 ENCOUNTER — Other Ambulatory Visit: Payer: Self-pay | Admitting: Nurse Practitioner

## 2024-06-16 ENCOUNTER — Other Ambulatory Visit: Payer: Self-pay | Admitting: Cardiology
